# Patient Record
Sex: Female | Born: 1975 | Hispanic: Yes | Marital: Married | Smoking: Never smoker
Health system: Southern US, Community
[De-identification: ages and names within clinical notes are randomized; demographics above are authoritative.]

## PROBLEM LIST (undated history)

## (undated) ENCOUNTER — Inpatient Hospital Stay (HOSPITAL_COMMUNITY): Payer: Self-pay

## (undated) DIAGNOSIS — O139 Gestational [pregnancy-induced] hypertension without significant proteinuria, unspecified trimester: Secondary | ICD-10-CM

## (undated) DIAGNOSIS — K8020 Calculus of gallbladder without cholecystitis without obstruction: Secondary | ICD-10-CM

## (undated) DIAGNOSIS — Z973 Presence of spectacles and contact lenses: Secondary | ICD-10-CM

## (undated) DIAGNOSIS — H11153 Pinguecula, bilateral: Secondary | ICD-10-CM

## (undated) HISTORY — DX: Gestational (pregnancy-induced) hypertension without significant proteinuria, unspecified trimester: O13.9

## (undated) HISTORY — DX: Pinguecula, bilateral: H11.153

## (undated) HISTORY — DX: Calculus of gallbladder without cholecystitis without obstruction: K80.20

---

## 2003-01-31 ENCOUNTER — Ambulatory Visit (HOSPITAL_COMMUNITY): Admission: RE | Admit: 2003-01-31 | Discharge: 2003-01-31 | Payer: Self-pay | Admitting: Gynecology

## 2003-01-31 ENCOUNTER — Encounter: Payer: Self-pay | Admitting: Gynecology

## 2003-12-22 ENCOUNTER — Inpatient Hospital Stay (HOSPITAL_COMMUNITY): Admission: AD | Admit: 2003-12-22 | Discharge: 2003-12-24 | Payer: Self-pay | Admitting: Obstetrics

## 2005-01-01 ENCOUNTER — Emergency Department (HOSPITAL_COMMUNITY): Admission: EM | Admit: 2005-01-01 | Discharge: 2005-01-01 | Payer: Self-pay | Admitting: Emergency Medicine

## 2005-01-14 ENCOUNTER — Emergency Department (HOSPITAL_COMMUNITY): Admission: EM | Admit: 2005-01-14 | Discharge: 2005-01-14 | Payer: Self-pay | Admitting: Emergency Medicine

## 2005-02-24 ENCOUNTER — Emergency Department (HOSPITAL_COMMUNITY): Admission: EM | Admit: 2005-02-24 | Discharge: 2005-02-24 | Payer: Self-pay | Admitting: Emergency Medicine

## 2006-05-02 ENCOUNTER — Ambulatory Visit (HOSPITAL_COMMUNITY): Admission: RE | Admit: 2006-05-02 | Discharge: 2006-05-02 | Payer: Self-pay | Admitting: Obstetrics & Gynecology

## 2006-05-03 ENCOUNTER — Inpatient Hospital Stay (HOSPITAL_COMMUNITY): Admission: AD | Admit: 2006-05-03 | Discharge: 2006-05-03 | Payer: Self-pay | Admitting: Obstetrics & Gynecology

## 2006-05-05 ENCOUNTER — Encounter (INDEPENDENT_AMBULATORY_CARE_PROVIDER_SITE_OTHER): Payer: Self-pay | Admitting: Specialist

## 2006-05-05 ENCOUNTER — Ambulatory Visit (HOSPITAL_COMMUNITY): Admission: AD | Admit: 2006-05-05 | Discharge: 2006-05-05 | Payer: Self-pay | Admitting: Obstetrics

## 2006-06-06 ENCOUNTER — Emergency Department (HOSPITAL_COMMUNITY): Admission: EM | Admit: 2006-06-06 | Discharge: 2006-06-06 | Payer: Self-pay | Admitting: Emergency Medicine

## 2006-06-11 ENCOUNTER — Emergency Department (HOSPITAL_COMMUNITY): Admission: EM | Admit: 2006-06-11 | Discharge: 2006-06-11 | Payer: Self-pay | Admitting: Emergency Medicine

## 2008-03-21 ENCOUNTER — Inpatient Hospital Stay (HOSPITAL_COMMUNITY): Admission: AD | Admit: 2008-03-21 | Discharge: 2008-03-21 | Payer: Self-pay | Admitting: Obstetrics

## 2008-07-12 ENCOUNTER — Inpatient Hospital Stay (HOSPITAL_COMMUNITY): Admission: AD | Admit: 2008-07-12 | Discharge: 2008-07-12 | Payer: Self-pay | Admitting: Obstetrics

## 2008-08-04 ENCOUNTER — Inpatient Hospital Stay (HOSPITAL_COMMUNITY): Admission: AD | Admit: 2008-08-04 | Discharge: 2008-08-08 | Payer: Self-pay | Admitting: Obstetrics & Gynecology

## 2010-08-10 ENCOUNTER — Inpatient Hospital Stay (HOSPITAL_COMMUNITY)
Admission: AD | Admit: 2010-08-10 | Discharge: 2010-08-11 | Payer: Self-pay | Source: Home / Self Care | Attending: Obstetrics | Admitting: Obstetrics

## 2010-08-12 ENCOUNTER — Ambulatory Visit (HOSPITAL_COMMUNITY)
Admission: RE | Admit: 2010-08-12 | Discharge: 2010-08-12 | Payer: Self-pay | Source: Home / Self Care | Attending: Obstetrics and Gynecology | Admitting: Obstetrics and Gynecology

## 2010-08-16 LAB — URINALYSIS, ROUTINE W REFLEX MICROSCOPIC
Bilirubin Urine: NEGATIVE
Ketones, ur: NEGATIVE mg/dL
Leukocytes, UA: NEGATIVE
Nitrite: NEGATIVE
Protein, ur: NEGATIVE mg/dL
Specific Gravity, Urine: <1.005 — ABNORMAL LOW (ref 1.005–1.030)
Urine Glucose, Fasting: NEGATIVE mg/dL
Urobilinogen, UA: 0.2 mg/dL (ref 0.0–1.0)
pH: 6.5 (ref 5.0–8.0)

## 2010-08-16 LAB — URINE CULTURE
Colony Count: 70000
Culture  Setup Time: 201201110551

## 2010-08-16 LAB — CBC
HCT: 36.3 % (ref 36.0–46.0)
HCT: 37 % (ref 36.0–46.0)
Hemoglobin: 11.9 g/dL — ABNORMAL LOW (ref 12.0–15.0)
Hemoglobin: 12.5 g/dL (ref 12.0–15.0)
MCH: 25.5 pg — ABNORMAL LOW (ref 26.0–34.0)
MCH: 26.5 pg (ref 26.0–34.0)
MCHC: 32.8 g/dL (ref 30.0–36.0)
MCHC: 33.8 g/dL (ref 30.0–36.0)
MCV: 77.9 fL — ABNORMAL LOW (ref 78.0–100.0)
MCV: 78.4 fL (ref 78.0–100.0)
Platelets: 234 10*3/uL (ref 150–400)
Platelets: 253 10*3/uL (ref 150–400)
RBC: 4.66 MIL/uL (ref 3.87–5.11)
RBC: 4.72 MIL/uL (ref 3.87–5.11)
RDW: 13.3 % (ref 11.5–15.5)
RDW: 13.2 % (ref 11.5–15.5)
WBC: 10.7 10*3/uL — ABNORMAL HIGH (ref 4.0–10.5)
WBC: 15.7 10*3/uL — ABNORMAL HIGH (ref 4.0–10.5)

## 2010-08-16 LAB — URINE MICROSCOPIC-ADD ON

## 2010-08-16 LAB — WET PREP, GENITAL
Clue Cells Wet Prep HPF POC: NONE SEEN
Trich, Wet Prep: NONE SEEN
Yeast Wet Prep HPF POC: NONE SEEN

## 2010-08-16 LAB — GC/CHLAMYDIA PROBE AMP, GENITAL
Chlamydia, DNA Probe: NEGATIVE
GC Probe Amp, Genital: NEGATIVE

## 2010-08-16 LAB — ABO/RH: ABO/RH(D): O POS

## 2010-08-16 LAB — HCG, QUANTITATIVE, PREGNANCY: hCG, Beta Chain, Quant, S: 2044 m[IU]/mL — ABNORMAL HIGH (ref <5)

## 2010-08-26 ENCOUNTER — Ambulatory Visit
Admission: RE | Admit: 2010-08-26 | Discharge: 2010-08-26 | Payer: Self-pay | Source: Home / Self Care | Attending: Obstetrics & Gynecology | Admitting: Obstetrics & Gynecology

## 2010-08-27 NOTE — Progress Notes (Unsigned)
NAME:  DE JAMESINA, GAUGH NO.:  [id number]  MEDICAL RECORD NO.:  [id number]          PATIENT TYPE:  WOC  LOCATION:  [facility]                   FACILITY:  [facility]  PHYSICIAN:  Argentina Donovan, MD        DATE OF BIRTH:  28-Jul-1976  DATE OF SERVICE:  08/26/2010                                 CLINIC NOTE  The patient is a 35 year old Spanish-speaking Hispanic female gravida 8, para 3-0-2-3 who underwent D and C for missed AB on the 12th of this month.  It was uneventful.  Since then several days ago, she started having sciatica pain on the right side, but it did not occur directly after the procedure, so probably it was not related to positioning during the procedure.  I have encouraged her to try some Motrin and if it persists to see an orthopedic surgeon.  She had a Pap smear 6 months ago, [facility] and I am giving her 2 packs of Lo Loestrin in the meanwhile to start today to cover her until she can get there.  IMPRESSION:  Satisfactory recovery post dilation and curettage.          ______________________________ Argentina Donovan, MD    PR/MEDQ  D:  08/26/2010  T:  08/27/2010  Job:  [id number]

## 2010-11-15 LAB — URINALYSIS, ROUTINE W REFLEX MICROSCOPIC
Bilirubin Urine: NEGATIVE
Glucose, UA: NEGATIVE mg/dL
Hgb urine dipstick: NEGATIVE
Ketones, ur: NEGATIVE mg/dL
Nitrite: NEGATIVE
Protein, ur: NEGATIVE mg/dL
Specific Gravity, Urine: <1.005 — ABNORMAL LOW (ref 1.005–1.030)
Urobilinogen, UA: 0.2 mg/dL (ref 0.0–1.0)
pH: 6 (ref 5.0–8.0)

## 2010-11-15 LAB — CBC
HCT: 30.9 % — ABNORMAL LOW (ref 36.0–46.0)
HCT: 38 % (ref 36.0–46.0)
HCT: 40.1 % (ref 36.0–46.0)
Hemoglobin: 10.3 g/dL — ABNORMAL LOW (ref 12.0–15.0)
Hemoglobin: 12.5 g/dL (ref 12.0–15.0)
Hemoglobin: 13.1 g/dL (ref 12.0–15.0)
MCHC: 32.7 g/dL (ref 30.0–36.0)
MCHC: 32.7 g/dL (ref 30.0–36.0)
MCV: 81.8 fL (ref 78.0–100.0)
MCV: 82 fL (ref 78.0–100.0)
MCV: 82.3 fL (ref 78.0–100.0)
Platelets: 231 10*3/uL (ref 150–400)
Platelets: 269 10*3/uL (ref 150–400)
RBC: 4.65 MIL/uL (ref 3.87–5.11)
RBC: 4.87 MIL/uL (ref 3.87–5.11)
RDW: 14.9 % (ref 11.5–15.5)
RDW: 14.9 % (ref 11.5–15.5)
RDW: 14.9 % (ref 11.5–15.5)
WBC: 10.6 10*3/uL — ABNORMAL HIGH (ref 4.0–10.5)
WBC: 8.1 10*3/uL (ref 4.0–10.5)

## 2010-11-15 LAB — COMPREHENSIVE METABOLIC PANEL
ALT: 16 U/L (ref 0–35)
ALT: 18 U/L (ref 0–35)
AST: 21 U/L (ref 0–37)
AST: 28 U/L (ref 0–37)
Albumin: 2.6 g/dL — ABNORMAL LOW (ref 3.5–5.2)
Albumin: 2.9 g/dL — ABNORMAL LOW (ref 3.5–5.2)
Alkaline Phosphatase: 108 U/L (ref 39–117)
Alkaline Phosphatase: 117 U/L (ref 39–117)
Alkaline Phosphatase: 91 U/L (ref 39–117)
BUN: 3 mg/dL — ABNORMAL LOW (ref 6–23)
BUN: 5 mg/dL — ABNORMAL LOW (ref 6–23)
BUN: 6 mg/dL (ref 6–23)
CO2: 23 mEq/L (ref 19–32)
CO2: 23 mEq/L (ref 19–32)
Calcium: 8.6 mg/dL (ref 8.4–10.5)
Calcium: 8.8 mg/dL (ref 8.4–10.5)
Chloride: 100 mEq/L (ref 96–112)
Chloride: 106 mEq/L (ref 96–112)
Creatinine, Ser: 0.44 mg/dL (ref 0.4–1.2)
Creatinine, Ser: 0.47 mg/dL (ref 0.4–1.2)
Creatinine, Ser: 0.62 mg/dL (ref 0.4–1.2)
GFR calc Af Amer: >60 mL/min (ref >60)
GFR calc Af Amer: >60 mL/min (ref >60)
GFR calc non Af Amer: >60 mL/min (ref >60)
GFR calc non Af Amer: >60 mL/min (ref >60)
Glucose, Bld: 104 mg/dL — ABNORMAL HIGH (ref 70–99)
Glucose, Bld: 146 mg/dL — ABNORMAL HIGH (ref 70–99)
Glucose, Bld: 86 mg/dL (ref 70–99)
Potassium: 3.5 mEq/L (ref 3.5–5.1)
Potassium: 3.9 mEq/L (ref 3.5–5.1)
Potassium: 4 mEq/L (ref 3.5–5.1)
Sodium: 133 mEq/L — ABNORMAL LOW (ref 135–145)
Sodium: 138 mEq/L (ref 135–145)
Total Bilirubin: 0.3 mg/dL (ref 0.3–1.2)
Total Bilirubin: 0.4 mg/dL (ref 0.3–1.2)
Total Bilirubin: 0.8 mg/dL (ref 0.3–1.2)
Total Protein: 4.7 g/dL — ABNORMAL LOW (ref 6.0–8.3)
Total Protein: 6.2 g/dL (ref 6.0–8.3)
Total Protein: 6.7 g/dL (ref 6.0–8.3)

## 2010-11-15 LAB — URIC ACID
Uric Acid, Serum: 3.8 mg/dL (ref 2.4–7.0)
Uric Acid, Serum: 4.3 mg/dL (ref 2.4–7.0)
Uric Acid, Serum: 4.4 mg/dL (ref 2.4–7.0)

## 2010-11-15 LAB — PROTEIN, URINE, 24 HOUR
Collection Interval-UPROT: 24 hours
Protein, 24H Urine: 92 mg/d (ref 50–100)
Protein, Urine: 3 mg/dL
Urine Total Volume-UPROT: 3050 mL

## 2010-11-15 LAB — CREATININE CLEARANCE, URINE, 24 HOUR
Collection Interval-CRCL: 24 hours
Creatinine Clearance: 215 mL/min — ABNORMAL HIGH (ref 75–115)
Creatinine, 24H Ur: 1453 mg/d (ref 700–1800)
Creatinine, Urine: 47.6 mg/dL
Creatinine: 0.47 mg/dL (ref 0.40–1.20)
Urine Total Volume-CRCL: 3050 mL

## 2010-11-15 LAB — LACTATE DEHYDROGENASE
LDH: 121 U/L (ref 94–250)
LDH: 204 U/L (ref 94–250)
LDH: 95 U/L (ref 94–250)

## 2010-12-14 NOTE — H&P (Signed)
NAMEMAIA, HANDA NO.:  [id number]   MEDICAL RECORD NO.:  [id number]          PATIENT TYPE:  INP   LOCATION:  9156                          [facility]   PHYSICIAN:  Roseanna Rainbow, M.D.DATE OF BIRTH:  [birth year]   DATE OF ADMISSION:  08/04/2008  DATE OF DISCHARGE:                              HISTORY & PHYSICAL   CHIEF COMPLAINT:  The patient is a 35 year old para 2 with an estimated  date of confinement of August 31, 2008, with an intrauterine pregnancy  at 36 plus weeks with elevated blood pressures.   HISTORY OF PRESENT ILLNESS:  The patient had presented to the office  earlier today and was found to have elevated blood pressures.  She  denied any neurological symptoms.  For the last 2 weeks, the patient's  blood pressures at our office visits were in the 130s to 160s over 80s  to 90s.  There is no significant proteinuria.   ALLERGIES:  No known drug allergies.   PRENATAL LABORATORIES:  Blood type O+.  Antibody screen negative.  Urine  culture and sensitivity, no growth.  Chlamydia probe negative.  GC probe  negative.  One-hour GTT 177.  Three-hour GTT fasting 94, otherwise  normal 3-hour GTT.  Hepatitis B surface antigen negative.  Hematocrit  38.5, hemoglobin 12.1.  HIV nonreactive.  Platelet count 295,000.  RPR  nonreactive.  Rubella immune.  Sickle cell negative.  Varicella immune.   PAST OBSTETRICAL HISTORY:  There is a history of a spontaneous abortion  in September 1999.  She was delivered of a live born female 7 pounds 5  ounces, vaginal delivery, no complications.  In May 2005, she was  delivered of a live born female 7 pounds 3 ounces.   PAST GYN HISTORY:  Noncontributory.   PAST MEDICAL HISTORY:  No significant history of medical diseases.   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  Does not give any significant history of alcohol usage, has no  significant smoking history.  Denies illicit drug  use.   FAMILY HISTORY:  Remarkable for chronic hypertension.   REVIEW OF SYSTEMS:  NEUROLOGIC:  She denies any headache, visual  disturbances.  GASTROINTESTINAL:  She denies any epigastric pain,  nausea, or vomiting.  PULMONARY:  She denies any shortness of breath.   PHYSICAL EXAMINATION:  VITAL SIGNS:  Blood pressures 130s to 160s over  80s to 100.  Fetal heart tracing reassuring.  GENERAL:  No apparent distress.  ABDOMEN:  Gravid, nontender.  GU:  Sterile vaginal exam, per the RN the cervix is 3 cm dilated, 50%  effaced.   LABORATORY DATA:  Hemoglobin 13.1, hematocrit 40.1, platelets 269,000.  Urinalysis, negative for protein, glucose 86, creatinine 0.47.  LFTs  normal.  LDH and uric acid all normal.  An ultrasound, normal amniotic  fluid index.  Anterior placenta, no previa.  Cephalic presentation.  Estimated fetal weight percentile at the 60th percentile.   ASSESSMENT:  Multipara at 36 weeks with likely gestational hypertension.  Labs remarkable for hemoconcentration. Appropriately grown fetus.   PLAN:  Admission.  We will check a 24-hour urine for protein and  creatinine, daily NSTs, bedrest, serial blood pressure checks.      Roseanna Rainbow, M.D.  Electronically Signed     LAJ/MEDQ  D:  08/04/2008  T:  08/05/2008  Job:  [id number]

## 2010-12-14 NOTE — Discharge Summary (Signed)
NAMECATHRYN, [name]             ACCOUNT NO.:  [id number]   MEDICAL RECORD NO.:  [id number]          PATIENT TYPE:  INP   LOCATION:  9128                          [facility]   PHYSICIAN:  Charles A. Clearance Coots, M.D.DATE OF BIRTH:  Dec 09, 1975   DATE OF ADMISSION:  08/04/2008  DATE OF DISCHARGE:  08/08/2008                               DISCHARGE SUMMARY   ADMITTING DIAGNOSES:  1. 36 weeks' gestation.  2. Pregnancy-induced hypertension.   DISCHARGE DIAGNOSES:  1. 36 weeks' gestation.  2. Pregnancy-induced hypertension.  3. Superimposed preeclampsia status post normal spontaneous vaginal      delivery viable female infant by induction of labor on August 06, 2008, at 36-4/7th weeks' gestation.  4. Severe preeclampsia.   Delivery time was 15:36 on August 06, 2008.  Apgars of 9 at 1 minute and  9 at 5 minutes, weight of 2465 g, length of 46.99 cm.  Mother and infant  discharged home in good condition.   REASON FOR ADMISSION:  A 35 year old, para 2, with estimated date of  confinement of August 31, 2008, with intrauterine pregnancy at 71  weeks' gestation that presented to the office with elevated blood  pressures.  She denied any neurological symptoms.  The patient's blood  pressure during her prenatal visits were 130-160 over 80-90 over the  past 2 weeks.  There was no significant proteinuria.   SURGERY:  None.   ILLNESSES:  None.   MEDICATIONS:  Prenatal vitamins.   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  Negative tobacco, alcohol, or recreational drug use.   FAMILY HISTORY:  Chronic hypertension.   REVIEW OF SYSTEMS:  Negative for headache or visual disturbances.  Negative for epigastric pain, nausea or vomiting.  Negative for  shortness of breath.   PHYSICAL EXAMINATION:  GENERAL:  Well-nourished, well-developed female  in no acute distress.  VITAL SIGNS:  Afebrile.  Blood pressures 130-160 over  80-100.  LUNGS:  Clear to auscultation bilaterally.  HEART:  Regular rate and rhythm.  ABDOMEN:  Gravid, nontender.  Cervix 3 cm dilated, 50% effaced, vertex  at a -3 station.   IMPRESSION:  A 36 weeks' gestation, pregnancy-induced hypertension.   PLAN:  Admit, bedrest, blood pressure management, will start 24-hour  urine for total protein and creatinine clearance.   ADMITTING LABORATORY DATA:  Hemoglobin 13, hematocrit 40, white blood  cell count 10,600, platelets 269,000.  Sodium 133, potassium 3.9, BUN 5,  creatinine 0.47.  Uric acid 4.3.  RPR nonreactive.   HOSPITAL COURSE:  The patient was admitted and placed on bedrest, 24-  hour urine was started.  Blood pressures remained stable.  A 24-hour  urine was completed and revealed a total protein revealed 92 mg per 24  hours, creatinine clearance of 215 mL per minute which were both within  normal limits.  The patient continued to have stable blood pressures  until hospital day 3 when blood pressures were elevated to systolic  blood pressures 160-170 over diastolic pressures of 90-100 plus.  A  decision was made to proceed with induction of labor for severe  pregnancy-induced hypertension.  The patient had already been started on  magnesium sulfate and p.o. labetalol for blood pressure control and this  was continued.  She was transferred to Labor and Delivery, and Pitocin  induction of labor was started.  She progressed rapidly to precipitous  normal spontaneous vaginal delivery on August 06, 2008.  There were no  intrapartum complications.  Postpartum course was uncomplicated.  The  patient was discharged home with stable blood pressures on p.o.  labetalol for blood pressure control on postpartum day #2.   DISCHARGE LABORATORY DATA:  Hemoglobin of 10, hematocrit of 30, white  blood cell count 10,900, platelets 200,000.   DISCHARGE DISPOSITION:  Medications continue labetalol for blood  pressure control.  Continue prenatal  vitamins.  Ibuprofen was prescribed  for pain.  Routine written instructions were given for discharge after  vaginal delivery.  The patient was given instructions for symptoms of  preeclampsia such as headache and visual disturbances.  She is to call  or return to the hospital if those occur.  Otherwise, she was instructed  to return to the office in 2 weeks for checkup.      Charles A. Clearance Coots, M.D.  Electronically Signed     CAH/MEDQ  D:  08/08/2008  T:  08/09/2008  Job:  [id number]

## 2010-12-17 NOTE — Op Note (Signed)
[name], KOZINSKI                ACCOUNT NO.:  [id number]   MEDICAL RECORD NO.:  [id number]          PATIENT TYPE:  AMB   LOCATION:  [facility]                           [facility]   PHYSICIAN:  Charles A. Clearance Coots, M.D.DATE OF BIRTH:  10-21-1975   DATE OF PROCEDURE:  05/05/2006  DATE OF DISCHARGE:                                 OPERATIVE REPORT   PREOP DIAGNOSIS:  1. A 14-week intrauterine fetal demise.  2. Incomplete spontaneous abortion.   POSTOP DIAGNOSIS:  1. A 14-week intrauterine fetal demise.  2. Incomplete spontaneous abortion.   PROCEDURE:  Suction, evacuation, and curettage.   SURGEON:  Charles A. Clearance Coots, M.D.   ANESTHESIA:  General and local paracervical block.   ESTIMATED BLOOD LOSS:  400 mL   COMPLICATIONS:  None   SPECIMEN:  Products of conception.   DISPOSITION OF SPECIMEN:  To pathology   FINDINGS:  Spontaneous delivery of fetus in triage, retained placenta.  Moderate amount of hemorrhage   OPERATION:  The patient was brought to the operating room; and after  satisfactory general endotracheal anesthesia the legs were brought up in  stirrups and the urinary bladder was emptied of approximately 20 mL of clear  urine.  The vagina was prepped and draped in the usual sterile fashion.  Sterile speculum was inserted in the vaginal vault and the cervix was noted  to be open; and the cord and placenta was in the cervical os.  The anterior  lip of cervix was grasped with a small ring forceps; and the body of the  placenta was grasped with ring forceps and removed.  The specimen was  submitted to pathology for evaluation.  A #12 suction catheter was then  easily introduced into the uterine cavity and the remainder of the products  of conception were evacuated.  The endometrial surface was curetted with a  medium sharp curette; and no further products of conception were obtained.  The  endometrial surface was felt to be gritty and the uterus contracted down  quite  well and there was no active bleeding at the conclusion of procedure,  all instruments were then retired.  All specimens were submitted to  pathology for evaluation.  The patient tolerated the procedure well,  transported to recovery room in satisfactory condition.      Charles A. Clearance Coots, M.D.  Electronically Signed     CAH/MEDQ  D:  05/05/2006  T:  05/05/2006  Job:  [id number]

## 2010-12-17 NOTE — H&P (Signed)
[name], [name]                ACCOUNT NO.:  [id number]   MEDICAL RECORD NO.:  [id number]          PATIENT TYPE:  AMB   LOCATION:  [facility]C                           [facility]   PHYSICIAN:  Roseanna Rainbow, M.D.DATE OF BIRTH:  1976/02/18   DATE OF ADMISSION:  DATE OF DISCHARGE:                                HISTORY & PHYSICAL   CHIEF COMPLAINT:  The patient is a 35 year old with a 14-week intrauterine  fetal demise, now for a suction dilatation and evacuation.   HISTORY OF PRESENT ILLNESS:  Please see the above.  The patient had  presented to the office several days ago with vaginal bleeding.  An informal  ultrasound in the office failed to demonstrate cardiac activity.  Demise was  confirmed in the radiology department at [facility].   PAST GYN HISTORY:  Noncontributory.   PAST MEDICAL HISTORY:  No significant history of medical diseases.   PAST SURGICAL HISTORY:  No previous surgeries.   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  She  does not give any significant history of alcohol use, and she has no  significant smoking history.  She denies illicit drug use.   FAMILY HISTORY:  No major illnesses known.   ALLERGIES:  NO KNOWN DRUG ALLERGIES.   PAST OB HISTORY:  She has had two normal spontaneous vaginal deliveries at  term, uncomplicated.   PHYSICAL EXAMINATION:  VITAL SIGNS:  Weight 144 pounds, blood pressure  124/81, temperature 98.1, pulse 85.  GENERAL:  Well-developed, well-nourished individual in no acute distress.  ABDOMEN:  Soft, nontender.  LUNGS:  Clear to auscultation bilaterally.  HEART:  Regular rate and rhythm.  PELVIC:  Normal EGBUS.  Vaginal exam, there is small heme-brown noted.  The  cervix was closed.   ASSESSMENT:  Fetal demise at 14 weeks.  The patient's blood type is O  positive.   PLAN:  The planned procedure is a dilatation and evacuation.  Will use  Cytotec preoperatively for cervical dilatation.   The risks, benefits, and  alternatives forms of management were reviewed with the patient, and  informed consent had been obtained.      Roseanna Rainbow, M.D.  Electronically Signed     LAJ/MEDQ  D:  05/04/2006  T:  05/04/2006  Job:  [id number]

## 2011-05-06 LAB — URINALYSIS, ROUTINE W REFLEX MICROSCOPIC
Bilirubin Urine: NEGATIVE
Ketones, ur: 15 mg/dL — AB
Nitrite: NEGATIVE
Specific Gravity, Urine: <1.005 — ABNORMAL LOW (ref 1.005–1.030)
Urobilinogen, UA: 0.2 mg/dL (ref 0.0–1.0)

## 2012-08-01 NOTE — L&D Delivery Note (Signed)
Delivery Note At 7:18 PM a viable female was delivered via  (Presentation: ;  ).  APGAR: , ; weight .   Placenta status: , .  Cord:  with the following complications: .  Cord pH: not done  Anesthesia:   Episiotomy:  Lacerations:  Suture Repair: 2.0 Est. Blood Loss (mL):   Mom to postpartum.  Baby to Couplet care / Skin to Skin.  MARSHALL,BERNARD A 06/10/2013, 7:26 PM

## 2012-11-28 ENCOUNTER — Ambulatory Visit (INDEPENDENT_AMBULATORY_CARE_PROVIDER_SITE_OTHER): Payer: BC Managed Care – PPO

## 2012-11-28 ENCOUNTER — Ambulatory Visit (INDEPENDENT_AMBULATORY_CARE_PROVIDER_SITE_OTHER): Payer: Self-pay | Admitting: Obstetrics

## 2012-11-28 ENCOUNTER — Encounter: Payer: Self-pay | Admitting: Obstetrics

## 2012-11-28 ENCOUNTER — Other Ambulatory Visit: Payer: Self-pay | Admitting: Obstetrics

## 2012-11-28 VITALS — BP 135/86 | Temp 98.0°F | Ht 62.0 in | Wt 154.4 lb

## 2012-11-28 DIAGNOSIS — O26849 Uterine size-date discrepancy, unspecified trimester: Secondary | ICD-10-CM

## 2012-11-28 DIAGNOSIS — O365990 Maternal care for other known or suspected poor fetal growth, unspecified trimester, not applicable or unspecified: Secondary | ICD-10-CM

## 2012-11-28 DIAGNOSIS — Z3482 Encounter for supervision of other normal pregnancy, second trimester: Secondary | ICD-10-CM

## 2012-11-28 DIAGNOSIS — Z113 Encounter for screening for infections with a predominantly sexual mode of transmission: Secondary | ICD-10-CM

## 2012-11-28 DIAGNOSIS — O09529 Supervision of elderly multigravida, unspecified trimester: Secondary | ICD-10-CM

## 2012-11-28 DIAGNOSIS — O368390 Maternal care for abnormalities of the fetal heart rate or rhythm, unspecified trimester, not applicable or unspecified: Secondary | ICD-10-CM

## 2012-11-28 DIAGNOSIS — Z124 Encounter for screening for malignant neoplasm of cervix: Secondary | ICD-10-CM

## 2012-11-28 DIAGNOSIS — Z3201 Encounter for pregnancy test, result positive: Secondary | ICD-10-CM

## 2012-11-28 LAB — POCT URINALYSIS DIPSTICK
Bilirubin, UA: NEGATIVE
Blood, UA: NEGATIVE
Glucose, UA: NEGATIVE
Nitrite, UA: NEGATIVE

## 2012-11-28 LAB — US OB COMP LESS 14 WKS

## 2012-11-28 NOTE — Progress Notes (Signed)
Size<Dates U/S reveals 11.5 week viable gestation.

## 2012-11-28 NOTE — Progress Notes (Signed)
.   Subjective:    [name] is being seen today for her first obstetrical visit.  This is a planned pregnancy. She is at [redacted]w[redacted]d gestation. Her obstetrical history is significant for advanced maternal age. Relationship with FOB: spouse, living together. Patient does intend to breast feed. Pregnancy history fully reviewed.  Menstrual History: OB History   Grav Para Term Preterm Abortions TAB SAB Ect Mult Living   6 3 3  2  2   3       Menarche age: Patient's last menstrual period was 08/22/2012.    The following portions of the patient's history were reviewed and updated as appropriate: allergies, current medications, past family history, past medical history, past social history, past surgical history and problem list.  Review of Systems Pertinent items are noted in HPI.    Objective:    General appearance: alert and no distress Abdomen: normal findings: soft, non-tender Pelvic: Uterus 11 weeks size.  Soft, nontender.   Assessment:    Pregnancy at 11 weeks.  Size<Dates U/S done and confirms 11.5 week viable IUP.   Plan:    Initial labs drawn. Prenatal vitamins. Problem list reviewed and updated. AFP3 discussed: undecided. Role of ultrasound in pregnancy discussed; fetal survey: undecided. Amniocentesis discussed: undecided. Follow up in 4 weeks. 50% of 20 min visit spent on counseling and coordination of care.

## 2012-11-29 ENCOUNTER — Encounter: Payer: Self-pay | Admitting: Obstetrics

## 2012-11-29 LAB — OBSTETRIC PANEL
Basophils Relative: 0 % (ref 0–1)
Eosinophils Absolute: 0.1 10*3/uL (ref 0.0–0.7)
Hepatitis B Surface Ag: NEGATIVE
MCH: 25.9 pg — ABNORMAL LOW (ref 26.0–34.0)
MCHC: 34 g/dL (ref 30.0–36.0)
Neutrophils Relative %: 63 % (ref 43–77)
Platelets: 288 10*3/uL (ref 150–400)
RDW: 15.2 % (ref 11.5–15.5)
Rh Type: POSITIVE

## 2012-11-29 LAB — VITAMIN D 25 HYDROXY (VIT D DEFICIENCY, FRACTURES): Vit D, 25-Hydroxy: 23 ng/mL — ABNORMAL LOW (ref 30–89)

## 2012-11-29 LAB — WET PREP BY MOLECULAR PROBE
Candida species: NEGATIVE
Gardnerella vaginalis: POSITIVE — AB
Trichomonas vaginosis: NEGATIVE

## 2012-11-29 LAB — PAP IG W/ RFLX HPV ASCU

## 2012-11-29 LAB — OB RESULTS CONSOLE GC/CHLAMYDIA: Chlamydia: NEGATIVE

## 2012-11-29 LAB — VARICELLA ZOSTER ANTIBODY, IGG: Varicella IgG: 760.8 Index — ABNORMAL HIGH (ref <135.00)

## 2012-11-30 LAB — CULTURE, OB URINE: Organism ID, Bacteria: NO GROWTH

## 2012-11-30 LAB — HEMOGLOBINOPATHY EVALUATION
Hemoglobin Other: 0 %
Hgb A2 Quant: 2.3 % (ref 2.2–3.2)
Hgb A: 97.7 % (ref 96.8–97.8)
Hgb F Quant: 0 % (ref 0.0–2.0)
Hgb S Quant: 0 %

## 2012-12-04 ENCOUNTER — Other Ambulatory Visit: Payer: Self-pay | Admitting: *Deleted

## 2012-12-04 DIAGNOSIS — B9689 Other specified bacterial agents as the cause of diseases classified elsewhere: Secondary | ICD-10-CM

## 2012-12-04 MED ORDER — METRONIDAZOLE 500 MG PO TABS: 500.0000 mg | ORAL_TABLET | Freq: Two times a day (BID) | ORAL | Status: DC

## 2012-12-06 ENCOUNTER — Other Ambulatory Visit: Payer: Self-pay | Admitting: *Deleted

## 2012-12-06 DIAGNOSIS — Z3482 Encounter for supervision of other normal pregnancy, second trimester: Secondary | ICD-10-CM

## 2012-12-06 MED ORDER — OB COMPLETE PETITE 35-5-1-200 MG PO CAPS: 1.0000 | ORAL_CAPSULE | Freq: Every day | ORAL | Status: DC

## 2012-12-06 NOTE — Progress Notes (Signed)
Pt has been advised of her test results regarding her low Vitamin D level. Rx for OB Complete Petite called to the pharamcy.

## 2012-12-27 ENCOUNTER — Ambulatory Visit (INDEPENDENT_AMBULATORY_CARE_PROVIDER_SITE_OTHER): Payer: BC Managed Care – PPO | Admitting: Obstetrics

## 2012-12-27 VITALS — BP 116/77 | Temp 98.2°F | Wt 155.0 lb

## 2012-12-27 DIAGNOSIS — O09522 Supervision of elderly multigravida, second trimester: Secondary | ICD-10-CM

## 2012-12-27 DIAGNOSIS — O09529 Supervision of elderly multigravida, unspecified trimester: Secondary | ICD-10-CM

## 2012-12-27 DIAGNOSIS — Z3482 Encounter for supervision of other normal pregnancy, second trimester: Secondary | ICD-10-CM

## 2012-12-27 DIAGNOSIS — Z369 Encounter for antenatal screening, unspecified: Secondary | ICD-10-CM

## 2012-12-27 DIAGNOSIS — Z3480 Encounter for supervision of other normal pregnancy, unspecified trimester: Secondary | ICD-10-CM

## 2012-12-27 LAB — POCT URINALYSIS DIPSTICK
Glucose, UA: NEGATIVE
Nitrite, UA: NEGATIVE
Spec Grav, UA: 1.015
Urobilinogen, UA: NEGATIVE

## 2012-12-27 NOTE — Progress Notes (Signed)
Pulse: 89

## 2012-12-28 ENCOUNTER — Encounter: Payer: Self-pay | Admitting: Obstetrics

## 2012-12-28 ENCOUNTER — Encounter (HOSPITAL_COMMUNITY): Payer: Self-pay | Admitting: Obstetrics

## 2012-12-28 LAB — AFP, QUAD SCREEN
AFP: 28 IU/mL
HCG, Total: 12020 m[IU]/mL
Interpretation-AFP: NEGATIVE
MoM for AFP: 1.02
Open Spina bifida: NEGATIVE
uE3 Mom: 0.81

## 2013-01-08 ENCOUNTER — Encounter: Payer: Self-pay | Admitting: Obstetrics

## 2013-01-10 ENCOUNTER — Other Ambulatory Visit: Payer: Self-pay | Admitting: Obstetrics

## 2013-01-10 DIAGNOSIS — O09529 Supervision of elderly multigravida, unspecified trimester: Secondary | ICD-10-CM

## 2013-01-11 ENCOUNTER — Ambulatory Visit (HOSPITAL_COMMUNITY): Payer: Self-pay | Attending: Obstetrics

## 2013-01-11 ENCOUNTER — Ambulatory Visit (HOSPITAL_COMMUNITY): Payer: Self-pay

## 2013-01-24 ENCOUNTER — Ambulatory Visit (INDEPENDENT_AMBULATORY_CARE_PROVIDER_SITE_OTHER): Payer: BC Managed Care – PPO | Admitting: Obstetrics

## 2013-01-24 ENCOUNTER — Encounter: Payer: Self-pay | Admitting: Obstetrics

## 2013-01-24 VITALS — BP 126/79 | Temp 98.0°F | Wt 156.8 lb

## 2013-01-24 DIAGNOSIS — Z369 Encounter for antenatal screening, unspecified: Secondary | ICD-10-CM

## 2013-01-24 DIAGNOSIS — Z3482 Encounter for supervision of other normal pregnancy, second trimester: Secondary | ICD-10-CM

## 2013-01-24 DIAGNOSIS — Z3480 Encounter for supervision of other normal pregnancy, unspecified trimester: Secondary | ICD-10-CM

## 2013-01-24 LAB — POCT URINALYSIS DIPSTICK
Bilirubin, UA: NEGATIVE
Blood, UA: NEGATIVE
Glucose, UA: NEGATIVE
Ketones, UA: NEGATIVE
Leukocytes, UA: NEGATIVE
pH, UA: 5

## 2013-01-24 NOTE — Progress Notes (Signed)
Pulse- 98.  Leg cramps

## 2013-01-29 ENCOUNTER — Other Ambulatory Visit: Payer: BC Managed Care – PPO

## 2013-02-21 ENCOUNTER — Ambulatory Visit (INDEPENDENT_AMBULATORY_CARE_PROVIDER_SITE_OTHER): Payer: BC Managed Care – PPO | Admitting: Obstetrics

## 2013-02-21 VITALS — BP 115/81 | Temp 98.2°F | Wt 160.2 lb

## 2013-02-21 DIAGNOSIS — Z3482 Encounter for supervision of other normal pregnancy, second trimester: Secondary | ICD-10-CM

## 2013-02-21 DIAGNOSIS — Z3480 Encounter for supervision of other normal pregnancy, unspecified trimester: Secondary | ICD-10-CM

## 2013-02-21 LAB — POCT URINALYSIS DIPSTICK
Bilirubin, UA: NEGATIVE
Blood, UA: NEGATIVE
Glucose, UA: NEGATIVE
Nitrite, UA: NEGATIVE
Spec Grav, UA: <=1.005

## 2013-02-21 NOTE — Progress Notes (Signed)
Pulse: 85

## 2013-03-07 ENCOUNTER — Other Ambulatory Visit: Payer: Medicaid Other

## 2013-03-07 ENCOUNTER — Ambulatory Visit (INDEPENDENT_AMBULATORY_CARE_PROVIDER_SITE_OTHER): Payer: Medicaid Other | Admitting: Obstetrics

## 2013-03-07 VITALS — BP 125/79 | Temp 97.9°F | Wt 163.0 lb

## 2013-03-07 DIAGNOSIS — Z3480 Encounter for supervision of other normal pregnancy, unspecified trimester: Secondary | ICD-10-CM

## 2013-03-07 DIAGNOSIS — Z3483 Encounter for supervision of other normal pregnancy, third trimester: Secondary | ICD-10-CM

## 2013-03-07 DIAGNOSIS — O09523 Supervision of elderly multigravida, third trimester: Secondary | ICD-10-CM

## 2013-03-07 DIAGNOSIS — Z3482 Encounter for supervision of other normal pregnancy, second trimester: Secondary | ICD-10-CM

## 2013-03-07 DIAGNOSIS — O09529 Supervision of elderly multigravida, unspecified trimester: Secondary | ICD-10-CM

## 2013-03-07 LAB — POCT URINALYSIS DIPSTICK
Blood, UA: NEGATIVE
Glucose, UA: NEGATIVE
Nitrite, UA: NEGATIVE
Protein, UA: NEGATIVE
Spec Grav, UA: 1.02
Urobilinogen, UA: NEGATIVE
pH, UA: 5

## 2013-03-07 LAB — CBC
Hemoglobin: 11.6 g/dL — ABNORMAL LOW (ref 12.0–15.0)
MCH: 26.5 pg (ref 26.0–34.0)
MCHC: 33.5 g/dL (ref 30.0–36.0)
Platelets: 268 10*3/uL (ref 150–400)

## 2013-03-07 NOTE — Progress Notes (Signed)
Pulse: 88

## 2013-03-08 ENCOUNTER — Encounter: Payer: Self-pay | Admitting: Obstetrics

## 2013-03-08 LAB — GLUCOSE TOLERANCE, 2 HOURS W/ 1HR
Glucose, 1 hour: 132 mg/dL (ref 70–170)
Glucose, 2 hour: 101 mg/dL (ref 70–139)
Glucose, Fasting: 69 mg/dL — ABNORMAL LOW (ref 70–99)

## 2013-03-08 LAB — RPR

## 2013-03-08 LAB — HIV ANTIBODY (ROUTINE TESTING W REFLEX): HIV: NONREACTIVE

## 2013-03-14 ENCOUNTER — Inpatient Hospital Stay (HOSPITAL_COMMUNITY): Admission: AD | Admit: 2013-03-14 | Payer: Self-pay | Source: Ambulatory Visit | Admitting: Obstetrics

## 2013-03-14 ENCOUNTER — Other Ambulatory Visit: Payer: Self-pay | Admitting: Obstetrics

## 2013-03-14 DIAGNOSIS — O09529 Supervision of elderly multigravida, unspecified trimester: Secondary | ICD-10-CM

## 2013-03-15 ENCOUNTER — Encounter: Payer: Self-pay | Admitting: Obstetrics

## 2013-03-15 ENCOUNTER — Ambulatory Visit (HOSPITAL_COMMUNITY)
Admission: RE | Admit: 2013-03-15 | Discharge: 2013-03-15 | Disposition: A | Discharge status: Home / Self Care | Payer: Medicaid Other | Source: Ambulatory Visit | Attending: Obstetrics | Admitting: Obstetrics

## 2013-03-15 DIAGNOSIS — O09529 Supervision of elderly multigravida, unspecified trimester: Secondary | ICD-10-CM | POA: Insufficient documentation

## 2013-03-15 DIAGNOSIS — Z363 Encounter for antenatal screening for malformations: Secondary | ICD-10-CM | POA: Insufficient documentation

## 2013-03-15 DIAGNOSIS — O358XX0 Maternal care for other (suspected) fetal abnormality and damage, not applicable or unspecified: Secondary | ICD-10-CM | POA: Insufficient documentation

## 2013-03-15 DIAGNOSIS — Z1389 Encounter for screening for other disorder: Secondary | ICD-10-CM | POA: Insufficient documentation

## 2013-03-15 LAB — US OB DETAIL + 14 WK

## 2013-03-15 NOTE — Progress Notes (Signed)
Genetic Counseling  High-Risk Gestation Note  Appointment Date:  03/15/2013 Referred By: Brock Bad, MD Date of Birth:  [birth year] Partner:  [name]    Pregnancy History: [id number] Estimated Date of Delivery: 06/14/13 Estimated Gestational Age: [redacted]w[redacted]d Attending: Particia Nearing, MD   [name], and her husband, [name], were seen for genetic counseling regarding a maternal age of 37 y.o.Marland Kitchen They were accompanied by their three children.   They were counseled regarding maternal age and the association with risk for chromosome conditions due to nondisjunction with aging of the ova.   We reviewed chromosomes, nondisjunction, and the associated 1 in 21 risk for fetal aneuploidy related to a maternal age of 37 y.o. at [redacted]w[redacted]d weeks gestation.  They were counseled that the risk for aneuploidy decreases as gestational age increases, accounting for those pregnancies which spontaneously abort.  We specifically discussed Down syndrome (trisomy 60), trisomies 23 and 52, and sex chromosome aneuploidies (47,XXX and 47,XXY) including the common features and prognoses of each.   We also reviewed [name]'s maternal serum Quad screen result and the associated reduction in risks for fetal Down syndrome (1 in 129 to 1 in 4800), trisomy 18 (1 in 2030), and ONTDs (1 in 12,400).  They understand that Quad screening provides a pregnancy specific risk for Down syndrome, but is not considered to be diagnostic.  They were counseled regarding other available screening options including noninvasive prenatal screening (NIPS) and detailed ultrasound. They were also counseled regarding the diagnostic testing option of amniocentesis.  The risks, benefits, and limitations of each of these options were reviewed in detail including the conditions for which each screens, the detection rates and false positive rates of each. We reviewed the associated 1 in 300-500 risk for complications, including  spontaneous preterm labor and delivery. After thoughtful consideration of these options, she elected to proceed with detailed ultrasound today, but declined NIPS and amniocentesis.  They understand that screening tests cannot rule out all birth defects or genetic syndromes. A complete detailed ultrasound was performed today.  The ultrasound report will be sent under separate cover.    [name] was provided with written information regarding cystic fibrosis (CF) including the carrier frequency and incidence in the Hispanic population, the availability of carrier testing and prenatal diagnosis if indicated.  In addition, we discussed that CF is routinely screened for as part of the [place] newborn screening panel.  She declined testing today.   Both family histories were reviewed and found to be noncontributory for birth defects, mental retardation, and known genetic conditions. Without further information regarding the provided family history, an accurate genetic risk cannot be calculated. Further genetic counseling is warranted if more information is obtained.  [name] denied exposure to environmental toxins or chemical agents. She denied the use of alcohol, tobacco or street drugs. She denied significant viral illnesses during the course of her pregnancy. Her medical and surgical histories were noncontributory.    I counseled this couple regarding the above risks and available options.  The approximate face-to-face time with the genetic counselor was 16 minutes.  Quinn Plowman, MS Certified Genetic Counselor 03/15/2013

## 2013-03-21 ENCOUNTER — Encounter: Payer: Self-pay | Admitting: Obstetrics

## 2013-03-21 ENCOUNTER — Ambulatory Visit (INDEPENDENT_AMBULATORY_CARE_PROVIDER_SITE_OTHER): Payer: Medicaid Other | Admitting: Obstetrics

## 2013-03-21 VITALS — BP 124/81 | Temp 96.9°F | Wt 167.0 lb

## 2013-03-21 DIAGNOSIS — Z3482 Encounter for supervision of other normal pregnancy, second trimester: Secondary | ICD-10-CM

## 2013-03-21 DIAGNOSIS — Z3483 Encounter for supervision of other normal pregnancy, third trimester: Secondary | ICD-10-CM

## 2013-03-21 DIAGNOSIS — Z3480 Encounter for supervision of other normal pregnancy, unspecified trimester: Secondary | ICD-10-CM

## 2013-03-21 LAB — POCT URINALYSIS DIPSTICK
Glucose, UA: NEGATIVE
Ketones, UA: NEGATIVE
Spec Grav, UA: >=1.03

## 2013-03-21 NOTE — Progress Notes (Signed)
Pulse 90, patient states she has no concerns

## 2013-03-25 ENCOUNTER — Encounter: Payer: Self-pay | Admitting: Obstetrics

## 2013-03-25 ENCOUNTER — Ambulatory Visit (INDEPENDENT_AMBULATORY_CARE_PROVIDER_SITE_OTHER): Payer: Medicaid Other | Admitting: Obstetrics

## 2013-03-25 VITALS — BP 115/78 | Temp 98.0°F | Wt 166.0 lb

## 2013-03-25 DIAGNOSIS — Z3480 Encounter for supervision of other normal pregnancy, unspecified trimester: Secondary | ICD-10-CM

## 2013-03-25 DIAGNOSIS — Z3483 Encounter for supervision of other normal pregnancy, third trimester: Secondary | ICD-10-CM

## 2013-03-25 LAB — POCT URINALYSIS DIPSTICK
Bilirubin, UA: NEGATIVE
Ketones, UA: NEGATIVE

## 2013-03-25 NOTE — Progress Notes (Signed)
P-85 

## 2013-04-08 ENCOUNTER — Encounter: Payer: Self-pay | Admitting: Obstetrics

## 2013-04-08 ENCOUNTER — Ambulatory Visit (INDEPENDENT_AMBULATORY_CARE_PROVIDER_SITE_OTHER): Payer: Medicaid Other | Admitting: Obstetrics

## 2013-04-08 VITALS — BP 127/85 | Temp 98.4°F | Wt 168.4 lb

## 2013-04-08 DIAGNOSIS — K219 Gastro-esophageal reflux disease without esophagitis: Secondary | ICD-10-CM

## 2013-04-08 DIAGNOSIS — Z3483 Encounter for supervision of other normal pregnancy, third trimester: Secondary | ICD-10-CM

## 2013-04-08 DIAGNOSIS — Z3480 Encounter for supervision of other normal pregnancy, unspecified trimester: Secondary | ICD-10-CM

## 2013-04-08 LAB — POCT URINALYSIS DIPSTICK
Bilirubin, UA: NEGATIVE
Leukocytes, UA: NEGATIVE
Nitrite, UA: NEGATIVE

## 2013-04-08 MED ORDER — OMEPRAZOLE 20 MG PO CPDR: 20.0000 mg | DELAYED_RELEASE_CAPSULE | Freq: Every day | ORAL | Status: DC

## 2013-04-08 NOTE — Progress Notes (Signed)
Pulse- 101 

## 2013-04-18 ENCOUNTER — Other Ambulatory Visit: Payer: Self-pay | Admitting: Obstetrics

## 2013-04-18 DIAGNOSIS — O09523 Supervision of elderly multigravida, third trimester: Secondary | ICD-10-CM

## 2013-04-22 ENCOUNTER — Ambulatory Visit (INDEPENDENT_AMBULATORY_CARE_PROVIDER_SITE_OTHER): Payer: Medicaid Other | Admitting: Obstetrics

## 2013-04-22 ENCOUNTER — Encounter: Payer: Self-pay | Admitting: Obstetrics

## 2013-04-22 VITALS — BP 119/79 | Temp 98.3°F | Wt 170.8 lb

## 2013-04-22 DIAGNOSIS — O09529 Supervision of elderly multigravida, unspecified trimester: Secondary | ICD-10-CM

## 2013-04-22 DIAGNOSIS — Z3480 Encounter for supervision of other normal pregnancy, unspecified trimester: Secondary | ICD-10-CM

## 2013-04-22 DIAGNOSIS — Z3483 Encounter for supervision of other normal pregnancy, third trimester: Secondary | ICD-10-CM

## 2013-04-22 DIAGNOSIS — O09523 Supervision of elderly multigravida, third trimester: Secondary | ICD-10-CM

## 2013-04-22 LAB — POCT URINALYSIS DIPSTICK
Blood, UA: NEGATIVE
Glucose, UA: NEGATIVE
Nitrite, UA: NEGATIVE
Urobilinogen, UA: NEGATIVE

## 2013-04-22 NOTE — Progress Notes (Signed)
Pulse: 92

## 2013-04-26 ENCOUNTER — Ambulatory Visit (HOSPITAL_COMMUNITY): Payer: Medicaid Other

## 2013-05-06 ENCOUNTER — Encounter: Payer: Self-pay | Admitting: Obstetrics

## 2013-05-06 ENCOUNTER — Ambulatory Visit (INDEPENDENT_AMBULATORY_CARE_PROVIDER_SITE_OTHER): Payer: Medicaid Other | Admitting: Obstetrics

## 2013-05-06 VITALS — BP 125/83 | Temp 97.8°F | Wt 174.0 lb

## 2013-05-06 DIAGNOSIS — Z3480 Encounter for supervision of other normal pregnancy, unspecified trimester: Secondary | ICD-10-CM

## 2013-05-06 DIAGNOSIS — Z3483 Encounter for supervision of other normal pregnancy, third trimester: Secondary | ICD-10-CM

## 2013-05-06 LAB — POCT URINALYSIS DIPSTICK: Spec Grav, UA: 1.015

## 2013-05-06 NOTE — Progress Notes (Signed)
Pulse: 90

## 2013-05-13 ENCOUNTER — Encounter: Payer: Self-pay | Admitting: Obstetrics

## 2013-05-13 ENCOUNTER — Ambulatory Visit (INDEPENDENT_AMBULATORY_CARE_PROVIDER_SITE_OTHER): Payer: Medicaid Other | Admitting: Obstetrics

## 2013-05-13 VITALS — BP 128/84 | Temp 97.9°F | Wt 176.0 lb

## 2013-05-13 DIAGNOSIS — Z3483 Encounter for supervision of other normal pregnancy, third trimester: Secondary | ICD-10-CM

## 2013-05-13 DIAGNOSIS — O0990 Supervision of high risk pregnancy, unspecified, unspecified trimester: Secondary | ICD-10-CM

## 2013-05-13 DIAGNOSIS — Z3480 Encounter for supervision of other normal pregnancy, unspecified trimester: Secondary | ICD-10-CM

## 2013-05-13 LAB — POCT URINALYSIS DIPSTICK
Blood, UA: NEGATIVE
Ketones, UA: NEGATIVE
Protein, UA: NEGATIVE
pH, UA: 6

## 2013-05-13 NOTE — Progress Notes (Signed)
Pulse: 91

## 2013-05-21 ENCOUNTER — Ambulatory Visit (INDEPENDENT_AMBULATORY_CARE_PROVIDER_SITE_OTHER): Payer: Medicaid Other | Admitting: Obstetrics

## 2013-05-21 ENCOUNTER — Encounter: Payer: Self-pay | Admitting: Obstetrics

## 2013-05-21 VITALS — BP 143/88 | Temp 97.0°F | Wt 177.0 lb

## 2013-05-21 DIAGNOSIS — Z3483 Encounter for supervision of other normal pregnancy, third trimester: Secondary | ICD-10-CM

## 2013-05-21 DIAGNOSIS — Z3480 Encounter for supervision of other normal pregnancy, unspecified trimester: Secondary | ICD-10-CM

## 2013-05-21 LAB — POCT URINALYSIS DIPSTICK
Bilirubin, UA: NEGATIVE
Blood, UA: NEGATIVE
Glucose, UA: NEGATIVE
Ketones, UA: NEGATIVE
Spec Grav, UA: 1.015

## 2013-05-21 NOTE — Progress Notes (Signed)
Pulse: 85

## 2013-05-25 ENCOUNTER — Inpatient Hospital Stay (HOSPITAL_COMMUNITY)
Admission: AD | Admit: 2013-05-25 | Discharge: 2013-05-25 | Disposition: A | Discharge status: Home / Self Care | Payer: BC Managed Care – PPO | Source: Ambulatory Visit | Attending: Obstetrics | Admitting: Obstetrics

## 2013-05-25 ENCOUNTER — Encounter (HOSPITAL_COMMUNITY): Payer: Self-pay | Admitting: *Deleted

## 2013-05-25 DIAGNOSIS — O4700 False labor before 37 completed weeks of gestation, unspecified trimester: Secondary | ICD-10-CM | POA: Insufficient documentation

## 2013-05-25 LAB — COMPREHENSIVE METABOLIC PANEL
CO2: 22 mEq/L (ref 19–32)
Calcium: 8.7 mg/dL (ref 8.4–10.5)
Creatinine, Ser: 0.49 mg/dL — ABNORMAL LOW (ref 0.50–1.10)
GFR calc Af Amer: >90 mL/min (ref >90)
GFR calc non Af Amer: >90 mL/min (ref >90)
Glucose, Bld: 92 mg/dL (ref 70–99)
Total Bilirubin: 0.5 mg/dL (ref 0.3–1.2)

## 2013-05-25 LAB — CBC
HCT: 35.7 % — ABNORMAL LOW (ref 36.0–46.0)
Hemoglobin: 12 g/dL (ref 12.0–15.0)
MCH: 26 pg (ref 26.0–34.0)
MCV: 77.4 fL — ABNORMAL LOW (ref 78.0–100.0)
RBC: 4.61 MIL/uL (ref 3.87–5.11)

## 2013-05-25 LAB — LACTATE DEHYDROGENASE: LDH: 245 U/L (ref 94–250)

## 2013-05-25 NOTE — MAU Note (Signed)
Pt presents with complaints of contractions that started at 3 this morning. Denies any bleeding or leakage of fluid

## 2013-05-25 NOTE — MAU Note (Signed)
Rates pain 4/10, began at 0330 this am, no bleeding or lof.

## 2013-05-28 ENCOUNTER — Encounter: Payer: Self-pay | Admitting: Obstetrics

## 2013-05-28 ENCOUNTER — Ambulatory Visit (INDEPENDENT_AMBULATORY_CARE_PROVIDER_SITE_OTHER): Payer: BC Managed Care – PPO | Admitting: Obstetrics

## 2013-05-28 VITALS — BP 147/86 | Temp 98.0°F | Wt 174.0 lb

## 2013-05-28 DIAGNOSIS — Z3480 Encounter for supervision of other normal pregnancy, unspecified trimester: Secondary | ICD-10-CM

## 2013-05-28 DIAGNOSIS — Z3483 Encounter for supervision of other normal pregnancy, third trimester: Secondary | ICD-10-CM

## 2013-05-28 LAB — POCT URINALYSIS DIPSTICK
Blood, UA: NEGATIVE
Ketones, UA: NEGATIVE
Nitrite, UA: NEGATIVE
Protein, UA: NEGATIVE
pH, UA: 5

## 2013-05-28 NOTE — Progress Notes (Signed)
P 101 Patient reports she went to the MAU last Saturday.

## 2013-06-03 ENCOUNTER — Ambulatory Visit (INDEPENDENT_AMBULATORY_CARE_PROVIDER_SITE_OTHER): Payer: BC Managed Care – PPO | Admitting: Obstetrics

## 2013-06-03 ENCOUNTER — Encounter: Payer: Medicaid Other | Admitting: Obstetrics

## 2013-06-03 VITALS — BP 137/88 | Temp 98.5°F | Wt 175.0 lb

## 2013-06-03 DIAGNOSIS — Z3480 Encounter for supervision of other normal pregnancy, unspecified trimester: Secondary | ICD-10-CM

## 2013-06-03 DIAGNOSIS — Z3483 Encounter for supervision of other normal pregnancy, third trimester: Secondary | ICD-10-CM

## 2013-06-03 LAB — POCT URINALYSIS DIPSTICK
Bilirubin, UA: NEGATIVE
Blood, UA: NEGATIVE
Nitrite, UA: NEGATIVE
Spec Grav, UA: 1.01
pH, UA: 6

## 2013-06-03 NOTE — Progress Notes (Signed)
HR - 92 Routine OB visit, denies any new concerns at this time.

## 2013-06-03 NOTE — Progress Notes (Signed)
Reactive NST 

## 2013-06-04 ENCOUNTER — Telehealth (HOSPITAL_COMMUNITY): Payer: Self-pay | Admitting: *Deleted

## 2013-06-04 ENCOUNTER — Other Ambulatory Visit: Payer: Self-pay | Admitting: *Deleted

## 2013-06-04 ENCOUNTER — Encounter: Payer: Self-pay | Admitting: Obstetrics

## 2013-06-04 NOTE — Telephone Encounter (Signed)
Preadmission screen Interpreter number [phone]

## 2013-06-05 ENCOUNTER — Encounter: Payer: Self-pay | Admitting: Obstetrics

## 2013-06-10 ENCOUNTER — Encounter: Payer: BC Managed Care – PPO | Admitting: Obstetrics

## 2013-06-10 ENCOUNTER — Inpatient Hospital Stay (HOSPITAL_COMMUNITY)
Admission: AD | Admit: 2013-06-10 | Discharge: 2013-06-12 | DRG: 775 | Disposition: A | Discharge status: Home / Self Care | Payer: BC Managed Care – PPO | Source: Ambulatory Visit | Attending: Obstetrics & Gynecology | Admitting: Obstetrics & Gynecology

## 2013-06-10 ENCOUNTER — Ambulatory Visit (INDEPENDENT_AMBULATORY_CARE_PROVIDER_SITE_OTHER): Payer: BC Managed Care – PPO | Admitting: Obstetrics & Gynecology

## 2013-06-10 ENCOUNTER — Encounter (HOSPITAL_COMMUNITY): Payer: Self-pay | Admitting: *Deleted

## 2013-06-10 ENCOUNTER — Inpatient Hospital Stay (HOSPITAL_COMMUNITY): Admission: RE | Admit: 2013-06-10 | Payer: Medicaid Other | Source: Ambulatory Visit

## 2013-06-10 VITALS — BP 130/89 | Temp 98.6°F | Wt 179.0 lb

## 2013-06-10 DIAGNOSIS — O289 Unspecified abnormal findings on antenatal screening of mother: Secondary | ICD-10-CM

## 2013-06-10 DIAGNOSIS — O139 Gestational [pregnancy-induced] hypertension without significant proteinuria, unspecified trimester: Principal | ICD-10-CM | POA: Diagnosis present

## 2013-06-10 DIAGNOSIS — Z3480 Encounter for supervision of other normal pregnancy, unspecified trimester: Secondary | ICD-10-CM

## 2013-06-10 DIAGNOSIS — Z3483 Encounter for supervision of other normal pregnancy, third trimester: Secondary | ICD-10-CM

## 2013-06-10 DIAGNOSIS — O133 Gestational [pregnancy-induced] hypertension without significant proteinuria, third trimester: Secondary | ICD-10-CM | POA: Diagnosis present

## 2013-06-10 LAB — POCT URINALYSIS DIPSTICK
Bilirubin, UA: NEGATIVE
Nitrite, UA: NEGATIVE
Spec Grav, UA: 1.025
Urobilinogen, UA: NEGATIVE
pH, UA: 5

## 2013-06-10 LAB — URINALYSIS, ROUTINE W REFLEX MICROSCOPIC
Glucose, UA: NEGATIVE mg/dL
Ketones, ur: NEGATIVE mg/dL
Leukocytes, UA: NEGATIVE
Nitrite: NEGATIVE
Specific Gravity, Urine: 1.025 (ref 1.005–1.030)
pH: 6 (ref 5.0–8.0)

## 2013-06-10 LAB — TYPE AND SCREEN
ABO/RH(D): O POS
Antibody Screen: NEGATIVE

## 2013-06-10 LAB — CBC WITH DIFFERENTIAL/PLATELET
HCT: 35 % — ABNORMAL LOW (ref 36.0–46.0)
Hemoglobin: 11.8 g/dL — ABNORMAL LOW (ref 12.0–15.0)
Lymphocytes Relative: 22 % (ref 12–46)
Lymphs Abs: 2.3 10*3/uL (ref 0.7–4.0)
Monocytes Absolute: 0.7 10*3/uL (ref 0.1–1.0)
Monocytes Relative: 7 % (ref 3–12)
Neutro Abs: 7.2 10*3/uL (ref 1.7–7.7)
Neutrophils Relative %: 71 % (ref 43–77)
RBC: 4.59 MIL/uL (ref 3.87–5.11)

## 2013-06-10 LAB — COMPREHENSIVE METABOLIC PANEL
ALT: 12 U/L (ref 0–35)
AST: 17 U/L (ref 0–37)
Alkaline Phosphatase: 168 U/L — ABNORMAL HIGH (ref 39–117)
BUN: 10 mg/dL (ref 6–23)
CO2: 20 mEq/L (ref 19–32)
Chloride: 101 mEq/L (ref 96–112)
GFR calc Af Amer: >90 mL/min (ref >90)
Glucose, Bld: 111 mg/dL — ABNORMAL HIGH (ref 70–99)
Potassium: 3.8 mEq/L (ref 3.5–5.1)
Sodium: 133 mEq/L — ABNORMAL LOW (ref 135–145)
Total Bilirubin: 0.2 mg/dL — ABNORMAL LOW (ref 0.3–1.2)
Total Protein: 6.3 g/dL (ref 6.0–8.3)

## 2013-06-10 LAB — URINE MICROSCOPIC-ADD ON

## 2013-06-10 LAB — LACTATE DEHYDROGENASE: LDH: 137 U/L (ref 94–250)

## 2013-06-10 MED ORDER — IBUPROFEN 600 MG PO TABS: 600.0000 mg | ORAL_TABLET | Freq: Four times a day (QID) | ORAL | Status: DC | PRN

## 2013-06-10 MED ORDER — PRENATAL MULTIVITAMIN CH
1.0000 | ORAL_TABLET | Freq: Every day | ORAL | Status: DC
  Administered 2013-06-11 – 2013-06-12 (×2): 1 via ORAL
  Filled 2013-06-10 (×2): qty 1

## 2013-06-10 MED ORDER — CITRIC ACID-SODIUM CITRATE 334-500 MG/5ML PO SOLN: 30.0000 mL | ORAL | Status: DC | PRN

## 2013-06-10 MED ORDER — DIPHENHYDRAMINE HCL 25 MG PO CAPS: 25.0000 mg | ORAL_CAPSULE | Freq: Four times a day (QID) | ORAL | Status: DC | PRN

## 2013-06-10 MED ORDER — LIDOCAINE HCL (PF) 1 % IJ SOLN: 30.0000 mL | INTRAMUSCULAR | Status: DC | PRN

## 2013-06-10 MED ORDER — ACETAMINOPHEN 325 MG PO TABS: 650.0000 mg | ORAL_TABLET | ORAL | Status: DC | PRN

## 2013-06-10 MED ORDER — HYDROXYZINE HCL 50 MG PO TABS: 50.0000 mg | ORAL_TABLET | Freq: Four times a day (QID) | ORAL | Status: DC | PRN

## 2013-06-10 MED ORDER — LACTATED RINGERS IV SOLN: INTRAVENOUS | Status: DC

## 2013-06-10 MED ORDER — LANOLIN HYDROUS EX OINT: TOPICAL_OINTMENT | CUTANEOUS | Status: DC | PRN

## 2013-06-10 MED ORDER — OXYTOCIN 40 UNITS IN LACTATED RINGERS INFUSION - SIMPLE MED: 1.0000 m[IU]/min | INTRAVENOUS | Status: DC

## 2013-06-10 MED ORDER — ONDANSETRON HCL 4 MG/2ML IJ SOLN: 4.0000 mg | Freq: Four times a day (QID) | INTRAMUSCULAR | Status: DC | PRN

## 2013-06-10 MED ORDER — FERROUS SULFATE 325 (65 FE) MG PO TABS
325.0000 mg | ORAL_TABLET | Freq: Two times a day (BID) | ORAL | Status: DC
  Administered 2013-06-11 – 2013-06-12 (×3): 325 mg via ORAL
  Filled 2013-06-10 (×3): qty 1

## 2013-06-10 MED ORDER — BUTORPHANOL TARTRATE 1 MG/ML IJ SOLN: 1.0000 mg | INTRAMUSCULAR | Status: DC | PRN

## 2013-06-10 MED ORDER — OXYTOCIN 40 UNITS IN LACTATED RINGERS INFUSION - SIMPLE MED: 62.5000 mL/h | INTRAVENOUS | Status: DC

## 2013-06-10 MED ORDER — IBUPROFEN 600 MG PO TABS
600.0000 mg | ORAL_TABLET | Freq: Four times a day (QID) | ORAL | Status: DC
  Administered 2013-06-11 – 2013-06-12 (×7): 600 mg via ORAL
  Filled 2013-06-10 (×7): qty 1

## 2013-06-10 MED ORDER — TERBUTALINE SULFATE 1 MG/ML IJ SOLN: 0.2500 mg | Freq: Once | INTRAMUSCULAR | Status: DC | PRN

## 2013-06-10 MED ORDER — ZOLPIDEM TARTRATE 5 MG PO TABS: 5.0000 mg | ORAL_TABLET | Freq: Every evening | ORAL | Status: DC | PRN

## 2013-06-10 MED ORDER — SENNOSIDES-DOCUSATE SODIUM 8.6-50 MG PO TABS
2.0000 | ORAL_TABLET | ORAL | Status: DC
  Administered 2013-06-11 (×2): 2 via ORAL
  Filled 2013-06-10 (×2): qty 2

## 2013-06-10 MED ORDER — LACTATED RINGERS IV SOLN: 500.0000 mL | INTRAVENOUS | Status: DC | PRN

## 2013-06-10 MED ORDER — OXYCODONE-ACETAMINOPHEN 5-325 MG PO TABS: 1.0000 | ORAL_TABLET | ORAL | Status: DC | PRN

## 2013-06-10 MED ORDER — LACTATED RINGERS IV SOLN
INTRAVENOUS | Status: DC
  Administered 2013-06-10: 16:00:00 via INTRAUTERINE

## 2013-06-10 MED ORDER — ONDANSETRON HCL 4 MG PO TABS: 4.0000 mg | ORAL_TABLET | ORAL | Status: DC | PRN

## 2013-06-10 MED ORDER — OXYTOCIN BOLUS FROM INFUSION: 500.0000 mL | INTRAVENOUS | Status: DC

## 2013-06-10 MED ORDER — LACTATED RINGERS IV SOLN
INTRAVENOUS | Status: DC
  Administered 2013-06-10: 14:00:00 via INTRAVENOUS

## 2013-06-10 MED ORDER — BENZOCAINE-MENTHOL 20-0.5 % EX AERO: 1.0000 "application " | INHALATION_SPRAY | CUTANEOUS | Status: DC | PRN

## 2013-06-10 MED ORDER — DIBUCAINE 1 % RE OINT: 1.0000 "application " | TOPICAL_OINTMENT | RECTAL | Status: DC | PRN

## 2013-06-10 MED ORDER — SIMETHICONE 80 MG PO CHEW: 80.0000 mg | CHEWABLE_TABLET | ORAL | Status: DC | PRN

## 2013-06-10 MED ORDER — LIDOCAINE HCL (PF) 1 % IJ SOLN
30.0000 mL | INTRAMUSCULAR | Status: DC | PRN
  Filled 2013-06-10 (×2): qty 30

## 2013-06-10 MED ORDER — TETANUS-DIPHTH-ACELL PERTUSSIS 5-2.5-18.5 LF-MCG/0.5 IM SUSP: 0.5000 mL | Freq: Once | INTRAMUSCULAR | Status: DC

## 2013-06-10 MED ORDER — FLEET ENEMA 7-19 GM/118ML RE ENEM: 1.0000 | ENEMA | RECTAL | Status: DC | PRN

## 2013-06-10 MED ORDER — OXYTOCIN 40 UNITS IN LACTATED RINGERS INFUSION - SIMPLE MED
1.0000 m[IU]/min | INTRAVENOUS | Status: DC
  Administered 2013-06-10: 2 m[IU]/min via INTRAVENOUS
  Filled 2013-06-10: qty 1000

## 2013-06-10 MED ORDER — ONDANSETRON HCL 4 MG/2ML IJ SOLN: 4.0000 mg | INTRAMUSCULAR | Status: DC | PRN

## 2013-06-10 MED ORDER — MISOPROSTOL 25 MCG QUARTER TABLET: 25.0000 ug | ORAL_TABLET | ORAL | Status: DC | PRN

## 2013-06-10 MED ORDER — OXYTOCIN BOLUS FROM INFUSION
500.0000 mL | INTRAVENOUS | Status: DC
  Administered 2013-06-10: 500 mL via INTRAVENOUS

## 2013-06-10 MED ORDER — INFLUENZA VAC SPLIT QUAD 0.5 ML IM SUSP
0.5000 mL | INTRAMUSCULAR | Status: AC
  Administered 2013-06-11: 0.5 mL via INTRAMUSCULAR
  Filled 2013-06-10: qty 0.5

## 2013-06-10 MED ORDER — WITCH HAZEL-GLYCERIN EX PADS: 1.0000 "application " | MEDICATED_PAD | CUTANEOUS | Status: DC | PRN

## 2013-06-10 NOTE — MAU Note (Signed)
Pt was sent from Dr. Marcia Brash office for [facility] evaluation and Fetal monitoring for some variables in office.   Pt has an induction scheduled for tonight.  Pt reports good fetal movement.  Denies vaginal bleeding or ROM.

## 2013-06-10 NOTE — H&P (Signed)
[name] is a 37 y.o. female presenting for IOL. Maternal Medical History:  Reason for admission: Positive non-stress test in the office today with variable decelerations.  She is being followed with fetal testing for gestational hypertension.  Fetal activity: Perceived fetal activity is normal.    Prenatal complications: Gestational hypertension  Prenatal Complications - Diabetes: none.    OB History   Grav Para Term Preterm Abortions TAB SAB Ect Mult Living   6 3 3  2  2   3      Past Medical History  Diagnosis Date  . PIH (pregnancy induced hypertension)    Past Surgical History  Procedure Laterality Date  . No past surgeries     Family History: family history includes Diabetes in her father; Hypertension in her mother. Social History:  reports that she has never smoked. She has never used smokeless tobacco. She reports that she does not drink alcohol or use illicit drugs.     Review of Systems  Constitutional: Negative for fever.  Eyes: Negative for blurred vision.  Respiratory: Negative for shortness of breath.   Gastrointestinal: Negative for vomiting.  Skin: Negative for rash.  Neurological: Negative for headaches.    Dilation: 3 Effacement (%): 80 Station: -2 Exam by:: Jackson-moore Blood pressure 118/66, pulse 83, temperature 98.5 F (36.9 C), temperature source Oral, resp. rate 18, height 5\' 2"  (1.575 m), weight 80.74 kg (178 lb), last menstrual period 08/22/2012, SpO2 99.00%. Maternal Exam:  Abdomen: Patient reports no abdominal tenderness. Fetal presentation: vertex  Introitus: Normal vulva. Amniotic fluid character: clear.  Pelvis: adequate for delivery.   Cervix: Cervix evaluated by digital exam.     Fetal Exam Fetal Monitor Review: Variability: moderate (6-25 bpm).   Pattern: accelerations present and variable decelerations.    Fetal State Assessment: Category II - tracings are indeterminate.     Physical Exam  Constitutional: She  appears well-developed.  HENT:  Head: Normocephalic.  Neck: Neck supple. No thyromegaly present.  Cardiovascular: Normal rate and regular rhythm.   Respiratory: Breath sounds normal.  GI: Soft. Bowel sounds are normal.  Skin: No rash noted.    Prenatal labs: ABO, Rh: O/POS/-- (04/30 1347) Antibody: NEG (04/30 1347) Rubella: 6.00 (04/30 1347) RPR: NON REAC (08/07 1112)  HBsAg: NEGATIVE (04/30 1347)  HIV: NON REACTIVE (08/07 1112)  GBS: NEGATIVE (10/13 1401)  Results for orders placed during the hospital encounter of 06/10/13 (from the past 24 hour(s))  URINALYSIS, ROUTINE W REFLEX MICROSCOPIC     Status: Abnormal   Collection Time    06/10/13 12:20 PM      Result Value Range   Color, Urine YELLOW  YELLOW   APPearance CLEAR  CLEAR   Specific Gravity, Urine 1.025  1.005 - 1.030   pH 6.0  5.0 - 8.0   Glucose, UA NEGATIVE  NEGATIVE mg/dL   Hgb urine dipstick TRACE (*) NEGATIVE   Bilirubin Urine NEGATIVE  NEGATIVE   Ketones, ur NEGATIVE  NEGATIVE mg/dL   Protein, ur NEGATIVE  NEGATIVE mg/dL   Urobilinogen, UA 0.2  0.0 - 1.0 mg/dL   Nitrite NEGATIVE  NEGATIVE   Leukocytes, UA NEGATIVE  NEGATIVE  PROTEIN / CREATININE RATIO, URINE     Status: None   Collection Time    06/10/13 12:20 PM      Result Value Range   Creatinine, Urine 135.96     Total Protein, Urine 12.8     PROTEIN CREATININE RATIO 0.09  0.00 - 0.15  URINE  MICROSCOPIC-ADD ON     Status: Abnormal   Collection Time    06/10/13 12:20 PM      Result Value Range   Squamous Epithelial / LPF FEW (*) RARE   WBC, UA 3-6  <3 WBC/hpf   RBC / HPF 0-2  <3 RBC/hpf   Bacteria, UA FEW (*) RARE  CBC WITH DIFFERENTIAL     Status: Abnormal   Collection Time    06/10/13 12:40 PM      Result Value Range   WBC 10.3  4.0 - 10.5 K/uL   RBC 4.59  3.87 - 5.11 MIL/uL   Hemoglobin 11.8 (*) 12.0 - 15.0 g/dL   HCT 62.1 (*) 30.8 - 65.7 %   MCV 76.3 (*) 78.0 - 100.0 fL   MCH 25.7 (*) 26.0 - 34.0 pg   MCHC 33.7  30.0 - 36.0 g/dL   RDW  84.6  96.2 - 95.2 %   Platelets 255  150 - 400 K/uL   Neutrophils Relative % 71  43 - 77 %   Neutro Abs 7.2  1.7 - 7.7 K/uL   Lymphocytes Relative 22  12 - 46 %   Lymphs Abs 2.3  0.7 - 4.0 K/uL   Monocytes Relative 7  3 - 12 %   Monocytes Absolute 0.7  0.1 - 1.0 K/uL   Eosinophils Relative 0  0 - 5 %   Eosinophils Absolute 0.0  0.0 - 0.7 K/uL   Basophils Relative 0  0 - 1 %   Basophils Absolute 0.0  0.0 - 0.1 K/uL  LACTATE DEHYDROGENASE     Status: None   Collection Time    06/10/13 12:40 PM      Result Value Range   LDH 137  94 - 250 U/L  COMPREHENSIVE METABOLIC PANEL     Status: Abnormal   Collection Time    06/10/13 12:40 PM      Result Value Range   Sodium 133 (*) 135 - 145 mEq/L   Potassium 3.8  3.5 - 5.1 mEq/L   Chloride 101  96 - 112 mEq/L   CO2 20  19 - 32 mEq/L   Glucose, Bld 111 (*) 70 - 99 mg/dL   BUN 10  6 - 23 mg/dL   Creatinine, Ser 8.41 (*) 0.50 - 1.10 mg/dL   Calcium 9.2  8.4 - 32.4 mg/dL   Total Protein 6.3  6.0 - 8.3 g/dL   Albumin 2.6 (*) 3.5 - 5.2 g/dL   AST 17  0 - 37 U/L   ALT 12  0 - 35 U/L   Alkaline Phosphatase 168 (*) 39 - 117 U/L   Total Bilirubin 0.2 (*) 0.3 - 1.2 mg/dL   GFR calc non Af Amer >90  >90 mL/min   GFR calc Af Amer >90  >90 mL/min   Assessment/Plan: Multipara @ [redacted]w[redacted]d.  Gestational HTN.  Category II FHT--LTV/accelerations c/w fetal well-being  Amnioinfusion Low dose Pitocin per protocol Monitor closely Anticipate SVD   JACKSON-MOORE,Davionne Dowty A 06/10/2013, 4:08 PM

## 2013-06-10 NOTE — Progress Notes (Signed)
HR - 106 Pt  In office for routine OB visit, denies any concerns at this time.  NST with a moderate variable deceleration.  To [facility] for admission.

## 2013-06-11 LAB — CBC
Hemoglobin: 11 g/dL — ABNORMAL LOW (ref 12.0–15.0)
MCH: 25.2 pg — ABNORMAL LOW (ref 26.0–34.0)
MCHC: 33.2 g/dL (ref 30.0–36.0)
MCV: 75.9 fL — ABNORMAL LOW (ref 78.0–100.0)
RBC: 4.36 MIL/uL (ref 3.87–5.11)
RDW: 13.4 % (ref 11.5–15.5)

## 2013-06-11 LAB — URINE CULTURE: Culture: NO GROWTH

## 2013-06-11 NOTE — Progress Notes (Signed)
Post Partum Day 1 Subjective: no complaints, up ad lib, voiding, tolerating PO and + flatus  Objective: Blood pressure 133/85, pulse 88, temperature 97.6 F (36.4 C), temperature source Oral, resp. rate 20, height 5\' 2"  (1.575 m), weight 178 lb (80.74 kg), last menstrual period 08/22/2012, SpO2 99.00%, unknown if currently breastfeeding.  Physical Exam:  General: alert Lochia: appropriate Uterine Fundus: firm Incision: NA DVT Evaluation: No evidence of DVT seen on physical exam.   Recent Labs  06/10/13 1240 06/11/13 0555  HGB 11.8* 11.0*  HCT 35.0* 33.1*    Assessment/Plan: Plan for discharge tomorrow, Breastfeeding and Contraception Micronor   LOS: 1 day   [name] 06/11/2013, 9:45 AM

## 2013-06-11 NOTE — Progress Notes (Signed)
UR chart review completed.  

## 2013-06-12 DIAGNOSIS — O133 Gestational [pregnancy-induced] hypertension without significant proteinuria, third trimester: Secondary | ICD-10-CM | POA: Diagnosis present

## 2013-06-12 LAB — RPR: RPR Ser Ql: NONREACTIVE

## 2013-06-12 MED ORDER — OXYCODONE-ACETAMINOPHEN 5-325 MG PO TABS: 1.0000 | ORAL_TABLET | ORAL | Status: DC | PRN

## 2013-06-12 MED ORDER — NORETHINDRONE 0.35 MG PO TABS: 1.0000 | ORAL_TABLET | Freq: Every day | ORAL | Status: DC

## 2013-06-12 NOTE — Discharge Summary (Signed)
Obstetric Discharge Summary Reason for Admission: induction of labor Prenatal Procedures: none Intrapartum Procedures: spontaneous vaginal delivery Postpartum Procedures: none Complications-Operative and Postpartum: none Hemoglobin  Date Value Range Status  06/11/2013 11.0* 12.0 - 15.0 g/dL Final     HCT  Date Value Range Status  06/11/2013 33.1* 36.0 - 46.0 % Final    Physical Exam:  General: alert Lochia: appropriate Uterine Fundus: firm Incision: n/a DVT Evaluation: No evidence of DVT seen on physical exam.  Discharge Diagnoses: Active Problems:   Gestational hypertension w/o significant proteinuria in 3rd trimester   Normal delivery    Discharge Information: Date: 06/12/2013 Activity: pelvic rest Diet: routine Medications: Percocet and Micronor Condition: stable Instructions: see above Discharge to: home Follow-up Information   Follow up with HARPER,CHARLES A, MD. Schedule an appointment as soon as possible for a visit in 2 days.   Specialty:  Obstetrics and Gynecology   Contact information:   [address] [place] [address] [phone]       Newborn Data: Live born female  Birth Weight: 6 lb 8.4 oz (2960 g) APGAR: 9, 9  Home with mother.  JACKSON-MOORE,Noha Karasik A 06/12/2013, 8:17 AM

## 2013-07-30 ENCOUNTER — Ambulatory Visit (INDEPENDENT_AMBULATORY_CARE_PROVIDER_SITE_OTHER): Payer: BC Managed Care – PPO | Admitting: Obstetrics

## 2013-07-30 ENCOUNTER — Encounter: Payer: Self-pay | Admitting: Obstetrics

## 2013-07-30 MED ORDER — PRENATAL PLUS 27-1 MG PO TABS: 30.0000 | ORAL_TABLET | Freq: Every day | ORAL | Status: DC

## 2013-07-30 NOTE — Progress Notes (Signed)
Subjective:     [name] is a 37 y.o. female who presents for a postpartum visit. She is 7 weeks postpartum following a spontaneous vaginal delivery. I have fully reviewed the prenatal and intrapartum course. The delivery was at 39 gestational weeks. Outcome: spontaneous vaginal delivery. Anesthesia: none. Postpartum course has been normal. Baby's course has been normal. Baby is feeding by breast and bottle gerber formula used. Bleeding no bleeding. Bowel function is normal. Bladder function is normal. Patient is not sexually active. Contraception method is abstinence and OCP (estrogen/progesterone). Postpartum depression screening: score 3 mild depression.  The following portions of the patient's history were reviewed and updated as appropriate: allergies, current medications, past family history, past medical history, past social history, past surgical history and problem list.  Review of Systems Pertinent items are noted in HPI.   Objective:    BP 128/85  Pulse 86  Temp(Src) 97.8 F (36.6 C)  Ht 5\' 3"  (1.6 m)  Wt 161 lb (73.029 kg)  BMI 28.53 kg/m2  Breastfeeding? No  General:  alert and no distress   Breasts:  inspection negative, no nipple discharge or bleeding, no masses or nodularity palpable Abdomen:  Soft, NT. Pelvic:  NEFG.  Uterus NSSC, NT.   Assessment:     Normal postpartum exam. Pap smear not done at today's visit.   Plan:    1. Contraception: oral progesterone-only contraceptive 2. Continue PNV's 3. Follow up in: several months or as needed.

## 2014-06-02 ENCOUNTER — Encounter: Payer: Self-pay | Admitting: Obstetrics

## 2014-06-05 ENCOUNTER — Encounter: Payer: Self-pay | Admitting: Obstetrics

## 2014-06-05 ENCOUNTER — Other Ambulatory Visit: Payer: Self-pay | Admitting: Obstetrics

## 2014-06-05 ENCOUNTER — Ambulatory Visit (INDEPENDENT_AMBULATORY_CARE_PROVIDER_SITE_OTHER): Payer: Medicaid Other | Admitting: Obstetrics

## 2014-06-05 VITALS — BP 124/88 | HR 91 | Temp 98.1°F | Ht 63.0 in | Wt 159.0 lb

## 2014-06-05 DIAGNOSIS — B373 Candidiasis of vulva and vagina: Secondary | ICD-10-CM

## 2014-06-05 DIAGNOSIS — B3731 Acute candidiasis of vulva and vagina: Secondary | ICD-10-CM

## 2014-06-05 DIAGNOSIS — Z0000 Encounter for general adult medical examination without abnormal findings: Secondary | ICD-10-CM

## 2014-06-05 DIAGNOSIS — Z3041 Encounter for surveillance of contraceptive pills: Secondary | ICD-10-CM

## 2014-06-05 MED ORDER — PRENATAL PLUS 27-1 MG PO TABS: 30.0000 | ORAL_TABLET | Freq: Every day | ORAL | Status: DC

## 2014-06-05 MED ORDER — TERCONAZOLE 0.4 % VA CREA: 1.0000 | TOPICAL_CREAM | Freq: Every day | VAGINAL | Status: DC

## 2014-06-05 MED ORDER — NORETHINDRONE 0.35 MG PO TABS: 1.0000 | ORAL_TABLET | Freq: Every day | ORAL | Status: DC

## 2014-06-05 NOTE — Progress Notes (Signed)
Subjective:     [name] is a 38 y.o. female here for a routine exam.  Current complaints: none.    Personal health questionnaire:  Is patient [name], have a family history of breast and/or ovarian cancer: no Is there a family history of uterine cancer diagnosed at age < 3750, gastrointestinal cancer, urinary tract cancer, family member who is a Personnel officerLynch syndrome-associated carrier: no Is the patient overweight and hypertensive, family history of diabetes, personal history of gestational diabetes or PCOS: no Is patient over 355, have PCOS,  family history of premature CHD under age 40, diabetes, smoke, have hypertension or peripheral artery disease:  no At any time, has a partner hit, kicked or otherwise hurt or frightened you?: no Over the past 2 weeks, have you felt down, depressed or hopeless?: no Over the past 2 weeks, have you felt little interest or pleasure in doing things?:no   Gynecologic History Patient's last menstrual period was 05/17/2014. Contraception: OCP Last Pap: 2014. Results were: normal Last mammogram: n/a. Results were: n/a  Obstetric History OB History  Gravida Para Term Preterm AB SAB TAB Ectopic Multiple Living  6 4 4  2 2    4     # Outcome Date GA Lbr Len/2nd Weight Sex Delivery Anes PTL Lv  6 Term 06/10/13 4557w3d 01:17 / 00:01 6 lb 8.4 oz (2.96 kg) M Vag-Spont None  Y     Comments: wnl  5 SAB 08/01/10 2226w0d         4 Term 08/06/08 4943w0d  6 lb (2.722 kg) M Vag-Spont None  Y     Comments: Hypertension  3 SAB 08/01/05 2434w0d         2 Term 12/22/03 6852w0d  8 lb (3.629 kg) F Vag-Spont None  Y     Comments: No complications  1 Term 04/25/98 4452w0d  7 lb 5 oz (3.317 kg) M Vag-Spont None  Y     Comments: No complications      Past Medical History  Diagnosis Date  . PIH (pregnancy induced hypertension)     Past Surgical History  Procedure Laterality Date  . No past surgeries      Current outpatient prescriptions: norethindrone (ORTHO MICRONOR)  0.35 MG tablet, Take 1 tablet (0.35 mg total) by mouth daily., Disp: 28 tablet, Rfl: 11 No Known Allergies  History  Substance Use Topics  . Smoking status: Never Smoker   . Smokeless tobacco: Never Used  . Alcohol Use: No    Family History  Problem Relation Age of Onset  . Hypertension Mother   . Diabetes Father       Review of Systems  Constitutional: negative for fatigue and weight loss Respiratory: negative for cough and wheezing Cardiovascular: negative for chest pain, fatigue and palpitations Gastrointestinal: negative for abdominal pain and change in bowel habits Musculoskeletal:negative for myalgias Neurological: negative for gait problems and tremors Behavioral/Psych: negative for abusive relationship, depression Endocrine: negative for temperature intolerance   Genitourinary:negative for abnormal menstrual periods, genital lesions, hot flashes, sexual problems and vaginal discharge Integument/breast: negative for breast lump, breast tenderness, nipple discharge and skin lesion(s)    Objective:       BP 124/88 mmHg  Pulse 91  Temp(Src) 98.1 F (36.7 C)  Ht 5\' 3"  (1.6 m)  Wt 159 lb (72.122 kg)  BMI 28.17 kg/m2  LMP 05/17/2014  Breastfeeding? Yes General:   alert  Skin:   no rash or abnormalities  Lungs:   clear to auscultation  bilaterally  Heart:   regular rate and rhythm, S1, S2 normal, no murmur, click, rub or gallop  Breasts:   normal without suspicious masses, skin or nipple changes or axillary nodes  Abdomen:  normal findings: no organomegaly, soft, non-tender and no hernia  Pelvis:  External genitalia: normal general appearance Urinary system: urethral meatus normal and bladder without fullness, nontender Vaginal: normal without tenderness, induration or masses Cervix: normal appearance Adnexa: normal bimanual exam Uterus: anteverted and non-tender, normal size   Lab Review Urine pregnancy test Labs reviewed yes Radiologic studies reviewed  no    Assessment:    Healthy female exam.    Plan:    Education reviewed: low fat, low cholesterol diet, weight bearing exercise and contraceptive options. Contraception: oral progesterone-only contraceptive. Follow up in: several months. Breast feeding recommended as long as possible   No orders of the defined types were placed in this encounter.   No orders of the defined types were placed in this encounter.

## 2014-06-06 LAB — WET PREP BY MOLECULAR PROBE
Candida species: NEGATIVE
Gardnerella vaginalis: NEGATIVE
TRICHOMONAS VAG: NEGATIVE

## 2014-06-06 LAB — GC/CHLAMYDIA PROBE AMP
CT PROBE, AMP APTIMA: NEGATIVE
GC Probe RNA: NEGATIVE

## 2014-06-09 LAB — PAP IG AND HPV HIGH-RISK: HPV DNA High Risk: NOT DETECTED

## 2014-06-23 ENCOUNTER — Other Ambulatory Visit (INDEPENDENT_AMBULATORY_CARE_PROVIDER_SITE_OTHER): Payer: Self-pay | Admitting: Surgery

## 2014-06-23 NOTE — H&P (Signed)
[name] 06/23/2014 2:06 PM Location: [facility] Patient #: [id number] DOB: [birth year] Married / Language: English / Race: Undefined Female History of Present Illness Sierra Fus([name] A. Sierra Tye MD; 06/23/2014 2:25 PM) Patient words: eval gallbladder  PT SENT DUE TO SYMPTOMATIC CHOLELITHIASIS AND EPIGASTR2IC ABDOMINAL PAIN AFTER EATING. mADE WORSE WITH FATS. SHARP AND LOCATION EPIGASTRIUM. U/S SHOWS GALLSTONES NO THICKENING AND NL CBD.  The patient is a 38 year old female who presents for evaluation of gall stones. The onset of the gall stones has been gradual and has been occurring in an intermittent pattern for 12 months. The course has been gradually worsening. The gall stones is described as moderate. There has been associated abdominal pain, back pain, burning pain and colicky pain. Precipitating factors include excessive fat intake. There are no relieving factors. Past Surgical History [name]([name]; 06/23/2014 2:08 PM) None 06/23/2014  Diagnostic Studies History [name]([name]; 06/23/2014 2:07 PM) Colonoscopy never Pap Smear never  Allergies [name]([name], [place]; 06/23/2014 2:07 PM) No Known Drug Allergies 06/23/2014  Medication History [name]([name], [place]; 06/23/2014 2:08 PM) [name] Micronor (0.35MG  Tablet, Oral) Active. Prenatal Multivit-Min-FA (1MG  Capsule, Oral) Active.  Pregnancy / Birth History [name]([name]; 06/23/2014 2:07 PM) Age at menarche 13 years. Gravida 5 Maternal age 40-25 Para 4 Regular periods     Review of Systems [name](Sierra R. Murphy CMA; 06/23/2014 2:07 PM) Gastrointestinal Present- Excessive gas. Not Present- Abdominal Pain, Bloating, Bloody Stool, Change in Bowel Habits, Chronic diarrhea, Constipation, Difficulty Swallowing, Gets full quickly at meals, Hemorrhoids, Indigestion, Nausea, Rectal Pain and Vomiting.  Vitals [employer](Sierra R. Murphy CMA; 06/23/2014 2:07 PM) 06/23/2014 2:06  PM Weight: 151.5 lb Height: 63in Body Surface Area: 1.75 m Body Mass Index: 26.84 kg/m BP: 122/80 (Sitting, Left Arm, Standard)     Physical Exam (Sierra Tidmore A. Sierra Switalski MD; 06/23/2014 2:21 PM)  General Mental Status-Alert. General Appearance-Consistent with stated age. Hydration-Well hydrated. Voice-Normal.  Head and Neck Head-normocephalic, atraumatic with no lesions or palpable masses.  Eye Eyeball - Bilateral-Extraocular movements intact. Sclera/Conjunctiva - Bilateral-No scleral icterus.  Chest and Lung Exam Chest and lung exam reveals -quiet, even and easy respiratory effort with no use of accessory muscles and on auscultation, normal breath sounds, no adventitious sounds and normal vocal resonance. Inspection Chest Wall - Normal. Back - normal.  Cardiovascular Cardiovascular examination reveals -on palpation PMI is normal in location and amplitude, no palpable S3 or S4. Normal cardiac borders., normal heart sounds, regular rate and rhythm with no murmurs, carotid auscultation reveals no bruits and normal pedal pulses bilaterally.  Abdomen Inspection Inspection of the abdomen reveals - No Hernias. Skin - Scar - no surgical scars. Palpation/Percussion Palpation and Percussion of the abdomen reveal - Soft, Non Tender, No Rebound tenderness, No Rigidity (guarding) and No hepatosplenomegaly. Auscultation Auscultation of the abdomen reveals - Bowel sounds normal.  Neurologic Neurologic evaluation reveals -alert and oriented x 3 with no impairment of recent or remote memory. Mental Status-Normal.  Musculoskeletal Normal Exam - Left-Upper Extremity Strength Normal and Lower Extremity Strength Normal. Normal Exam - Right-Upper Extremity Strength Normal, Lower Extremity Weakness.    Assessment & Plan ([name] A. 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 RECOMMEND LAPAROSCOPIC CHOLECYSTECTOMY AND CHOLANGIOGRAM. The procedure has been discussed with the patient. Risks of laparoscopic cholecystectomy include bleeding, infection, bile duct injury, leak, death, open surgery, diarrhea, other surgery, organ injury, blood vessel injury, DVT,  and additional care.  Current Plans Pt Education - [facility] Laparoscopic Surgery HCI Pt Education - Cholecystectomy: Gallbladder Removal with Open Surgery: laparoscopic cholecystectomy

## 2014-07-11 ENCOUNTER — Encounter (HOSPITAL_BASED_OUTPATIENT_CLINIC_OR_DEPARTMENT_OTHER): Payer: Self-pay | Admitting: *Deleted

## 2014-07-15 ENCOUNTER — Encounter (HOSPITAL_BASED_OUTPATIENT_CLINIC_OR_DEPARTMENT_OTHER)
Admission: RE | Admit: 2014-07-15 | Discharge: 2014-07-15 | Disposition: A | Discharge status: Home / Self Care | Payer: Medicaid Other | Source: Ambulatory Visit | Attending: Surgery | Admitting: Surgery

## 2014-07-15 ENCOUNTER — Encounter (HOSPITAL_BASED_OUTPATIENT_CLINIC_OR_DEPARTMENT_OTHER): Payer: Self-pay | Admitting: *Deleted

## 2014-07-15 DIAGNOSIS — K219 Gastro-esophageal reflux disease without esophagitis: Secondary | ICD-10-CM | POA: Diagnosis not present

## 2014-07-15 DIAGNOSIS — K8010 Calculus of gallbladder with chronic cholecystitis without obstruction: Secondary | ICD-10-CM | POA: Diagnosis not present

## 2014-07-15 DIAGNOSIS — K8020 Calculus of gallbladder without cholecystitis without obstruction: Secondary | ICD-10-CM | POA: Diagnosis present

## 2014-07-15 DIAGNOSIS — Z8249 Family history of ischemic heart disease and other diseases of the circulatory system: Secondary | ICD-10-CM | POA: Diagnosis not present

## 2014-07-15 DIAGNOSIS — Z833 Family history of diabetes mellitus: Secondary | ICD-10-CM | POA: Diagnosis not present

## 2014-07-15 LAB — COMPREHENSIVE METABOLIC PANEL
ALBUMIN: 4.1 g/dL (ref 3.5–5.2)
ALT: 17 U/L (ref 0–35)
ANION GAP: 14 (ref 5–15)
AST: 19 U/L (ref 0–37)
Alkaline Phosphatase: 89 U/L (ref 39–117)
BILIRUBIN TOTAL: 0.8 mg/dL (ref 0.3–1.2)
BUN: 10 mg/dL (ref 6–23)
CO2: 24 mEq/L (ref 19–32)
Calcium: 9.2 mg/dL (ref 8.4–10.5)
Chloride: 103 mEq/L (ref 96–112)
Creatinine, Ser: 0.58 mg/dL (ref 0.50–1.10)
GFR calc non Af Amer: >90 mL/min (ref >90)
Glucose, Bld: 103 mg/dL — ABNORMAL HIGH (ref 70–99)
Potassium: 3.6 mEq/L — ABNORMAL LOW (ref 3.7–5.3)
Sodium: 141 mEq/L (ref 137–147)
Total Protein: 7.6 g/dL (ref 6.0–8.3)

## 2014-07-15 LAB — CBC WITH DIFFERENTIAL/PLATELET
BASOS PCT: 0 % (ref 0–1)
Basophils Absolute: 0 10*3/uL (ref 0.0–0.1)
EOS ABS: 0 10*3/uL (ref 0.0–0.7)
Eosinophils Relative: 1 % (ref 0–5)
HEMATOCRIT: 39.2 % (ref 36.0–46.0)
HEMOGLOBIN: 12.8 g/dL (ref 12.0–15.0)
Lymphocytes Relative: 32 % (ref 12–46)
Lymphs Abs: 2.6 10*3/uL (ref 0.7–4.0)
MCH: 25.2 pg — AB (ref 26.0–34.0)
MCHC: 32.7 g/dL (ref 30.0–36.0)
MCV: 77.2 fL — AB (ref 78.0–100.0)
MONOS PCT: 4 % (ref 3–12)
Monocytes Absolute: 0.4 10*3/uL (ref 0.1–1.0)
Neutro Abs: 5 10*3/uL (ref 1.7–7.7)
Neutrophils Relative %: 63 % (ref 43–77)
Platelets: 276 10*3/uL (ref 150–400)
RBC: 5.08 MIL/uL (ref 3.87–5.11)
RDW: 13.2 % (ref 11.5–15.5)
WBC: 8 10*3/uL (ref 4.0–10.5)

## 2014-07-16 ENCOUNTER — Encounter (HOSPITAL_BASED_OUTPATIENT_CLINIC_OR_DEPARTMENT_OTHER): Payer: Self-pay | Admitting: *Deleted

## 2014-07-16 ENCOUNTER — Ambulatory Visit (HOSPITAL_BASED_OUTPATIENT_CLINIC_OR_DEPARTMENT_OTHER)
Admission: RE | Admit: 2014-07-16 | Discharge: 2014-07-16 | Disposition: A | Discharge status: Home / Self Care | Payer: Medicaid Other | Source: Ambulatory Visit | Attending: Surgery | Admitting: Surgery

## 2014-07-16 ENCOUNTER — Ambulatory Visit (HOSPITAL_BASED_OUTPATIENT_CLINIC_OR_DEPARTMENT_OTHER): Payer: Medicaid Other | Admitting: Certified Registered"

## 2014-07-16 ENCOUNTER — Encounter (HOSPITAL_BASED_OUTPATIENT_CLINIC_OR_DEPARTMENT_OTHER)
Admission: RE | Disposition: A | Discharge status: Home / Self Care | Payer: Self-pay | Source: Ambulatory Visit | Attending: Surgery

## 2014-07-16 DIAGNOSIS — Z833 Family history of diabetes mellitus: Secondary | ICD-10-CM | POA: Insufficient documentation

## 2014-07-16 DIAGNOSIS — K8010 Calculus of gallbladder with chronic cholecystitis without obstruction: Secondary | ICD-10-CM | POA: Diagnosis not present

## 2014-07-16 DIAGNOSIS — Z8249 Family history of ischemic heart disease and other diseases of the circulatory system: Secondary | ICD-10-CM | POA: Insufficient documentation

## 2014-07-16 DIAGNOSIS — K219 Gastro-esophageal reflux disease without esophagitis: Secondary | ICD-10-CM | POA: Diagnosis not present

## 2014-07-16 DIAGNOSIS — Z419 Encounter for procedure for purposes other than remedying health state, unspecified: Secondary | ICD-10-CM

## 2014-07-16 HISTORY — DX: Presence of spectacles and contact lenses: Z97.3

## 2014-07-16 HISTORY — PX: CHOLECYSTECTOMY: SHX55

## 2014-07-16 SURGERY — LAPAROSCOPIC CHOLECYSTECTOMY WITH INTRAOPERATIVE CHOLANGIOGRAM
Anesthesia: General | Site: Abdomen

## 2014-07-16 MED ORDER — SODIUM CHLORIDE 0.9 % IR SOLN
Status: DC | PRN
  Administered 2014-07-16: 1

## 2014-07-16 MED ORDER — ATROPINE SULFATE 0.4 MG/ML IJ SOLN
INTRAMUSCULAR | Status: DC | PRN
  Administered 2014-07-16: 0.4 mg via INTRAVENOUS

## 2014-07-16 MED ORDER — EPHEDRINE SULFATE 50 MG/ML IJ SOLN
INTRAMUSCULAR | Status: DC | PRN
  Administered 2014-07-16: 10 mg via INTRAVENOUS

## 2014-07-16 MED ORDER — OXYCODONE-ACETAMINOPHEN 5-325 MG PO TABS: 1.0000 | ORAL_TABLET | ORAL | Status: DC | PRN

## 2014-07-16 MED ORDER — SUCCINYLCHOLINE CHLORIDE 20 MG/ML IJ SOLN
INTRAMUSCULAR | Status: DC | PRN
  Administered 2014-07-16: 100 mg via INTRAVENOUS

## 2014-07-16 MED ORDER — FENTANYL CITRATE 0.05 MG/ML IJ SOLN: 50.0000 ug | INTRAMUSCULAR | Status: DC | PRN

## 2014-07-16 MED ORDER — PROPOFOL 10 MG/ML IV BOLUS
INTRAVENOUS | Status: AC
  Filled 2014-07-16: qty 20

## 2014-07-16 MED ORDER — MIDAZOLAM HCL 2 MG/2ML IJ SOLN: 1.0000 mg | INTRAMUSCULAR | Status: DC | PRN

## 2014-07-16 MED ORDER — CEFAZOLIN SODIUM-DEXTROSE 2-3 GM-% IV SOLR
2.0000 g | INTRAVENOUS | Status: AC
  Administered 2014-07-16: 2 g via INTRAVENOUS

## 2014-07-16 MED ORDER — HYDROMORPHONE HCL 1 MG/ML IJ SOLN
0.2500 mg | INTRAMUSCULAR | Status: DC | PRN
  Administered 2014-07-16 – 2014-07-17 (×4): 0.5 mg via INTRAVENOUS

## 2014-07-16 MED ORDER — DIPHENHYDRAMINE HCL 50 MG/ML IJ SOLN
INTRAMUSCULAR | Status: DC | PRN
  Administered 2014-07-16: 6 mg via INTRAVENOUS

## 2014-07-16 MED ORDER — HYDROMORPHONE HCL 1 MG/ML IJ SOLN
INTRAMUSCULAR | Status: AC
  Filled 2014-07-16: qty 1

## 2014-07-16 MED ORDER — FENTANYL CITRATE 0.05 MG/ML IJ SOLN
INTRAMUSCULAR | Status: DC | PRN
  Administered 2014-07-16 (×2): 50 ug via INTRAVENOUS
  Administered 2014-07-16: 100 ug via INTRAVENOUS

## 2014-07-16 MED ORDER — OXYCODONE HCL 5 MG PO TABS
ORAL_TABLET | ORAL | Status: AC
  Filled 2014-07-16: qty 1

## 2014-07-16 MED ORDER — SODIUM CHLORIDE 0.9 % IV SOLN: INTRAVENOUS | Status: DC | PRN

## 2014-07-16 MED ORDER — LACTATED RINGERS IV SOLN
INTRAVENOUS | Status: DC
  Administered 2014-07-16 (×3): via INTRAVENOUS

## 2014-07-16 MED ORDER — MIDAZOLAM HCL 5 MG/5ML IJ SOLN
INTRAMUSCULAR | Status: DC | PRN
  Administered 2014-07-16: 2 mg via INTRAVENOUS

## 2014-07-16 MED ORDER — CHLORHEXIDINE GLUCONATE 4 % EX LIQD: 1.0000 "application " | Freq: Once | CUTANEOUS | Status: DC

## 2014-07-16 MED ORDER — PROPOFOL 10 MG/ML IV BOLUS
INTRAVENOUS | Status: DC | PRN
  Administered 2014-07-16: 150 mg via INTRAVENOUS
  Administered 2014-07-16: 30 mg via INTRAVENOUS

## 2014-07-16 MED ORDER — MIDAZOLAM HCL 5 MG/5ML IJ SOLN: INTRAMUSCULAR | Status: DC | PRN

## 2014-07-16 MED ORDER — DEXAMETHASONE SODIUM PHOSPHATE 4 MG/ML IJ SOLN
INTRAMUSCULAR | Status: DC | PRN
  Administered 2014-07-16: 10 mg via INTRAVENOUS

## 2014-07-16 MED ORDER — OXYCODONE HCL 5 MG PO TABS
5.0000 mg | ORAL_TABLET | Freq: Once | ORAL | Status: AC
  Administered 2014-07-16: 5 mg via ORAL

## 2014-07-16 MED ORDER — CEFAZOLIN SODIUM-DEXTROSE 2-3 GM-% IV SOLR
INTRAVENOUS | Status: AC
  Filled 2014-07-16: qty 50

## 2014-07-16 MED ORDER — ONDANSETRON HCL 4 MG/2ML IJ SOLN
INTRAMUSCULAR | Status: DC | PRN
Start: 2014-07-16 — End: 2014-07-16
  Administered 2014-07-16: 4 mg via INTRAVENOUS

## 2014-07-16 MED ORDER — BUPIVACAINE-EPINEPHRINE (PF) 0.25% -1:200000 IJ SOLN
INTRAMUSCULAR | Status: AC
  Filled 2014-07-16: qty 30

## 2014-07-16 MED ORDER — MIDAZOLAM HCL 2 MG/2ML IJ SOLN
INTRAMUSCULAR | Status: AC
  Filled 2014-07-16: qty 2

## 2014-07-16 MED ORDER — FENTANYL CITRATE 0.05 MG/ML IJ SOLN
INTRAMUSCULAR | Status: AC
  Filled 2014-07-16: qty 6

## 2014-07-16 MED ORDER — LIDOCAINE HCL (CARDIAC) 20 MG/ML IV SOLN
INTRAVENOUS | Status: DC | PRN
  Administered 2014-07-16: 40 mg via INTRAVENOUS

## 2014-07-16 MED ORDER — BUPIVACAINE-EPINEPHRINE 0.25% -1:200000 IJ SOLN
INTRAMUSCULAR | Status: DC | PRN
  Administered 2014-07-16: 4 mL

## 2014-07-16 SURGICAL SUPPLY — 48 items
APPLIER CLIP ROT 10 11.4 M/L (STAPLE) ×3
APR CLP MED LRG 11.4X10 (STAPLE) ×1
BAG SPEC RTRVL LRG 6X4 10 (ENDOMECHANICALS) ×1
BLADE CLIPPER SURG (BLADE) IMPLANT
CANISTER SUCT 1200ML W/VALVE (MISCELLANEOUS) ×5 IMPLANT
CHLORAPREP W/TINT 26ML (MISCELLANEOUS) ×3 IMPLANT
CLIP APPLIE ROT 10 11.4 M/L (STAPLE) ×1 IMPLANT
CLOSURE WOUND 1/2 X4 (GAUZE/BANDAGES/DRESSINGS)
COVER MAYO STAND STRL (DRAPES) ×3 IMPLANT
DECANTER SPIKE VIAL GLASS SM (MISCELLANEOUS) ×2 IMPLANT
DRAPE C-ARM 42X72 X-RAY (DRAPES) ×3 IMPLANT
ELECT REM PT RETURN 9FT ADLT (ELECTROSURGICAL) ×3
ELECTRODE REM PT RTRN 9FT ADLT (ELECTROSURGICAL) ×1 IMPLANT
FILTER SMOKE EVAC LAPAROSHD (FILTER) IMPLANT
GLOVE BIO SURGEON STRL SZ7 (GLOVE) ×2 IMPLANT
GLOVE BIO SURGEON STRL SZ8 (GLOVE) ×3 IMPLANT
GLOVE BIOGEL PI IND STRL 6.5 (GLOVE) IMPLANT
GLOVE BIOGEL PI IND STRL 7.5 (GLOVE) IMPLANT
GLOVE BIOGEL PI IND STRL 8 (GLOVE) ×1 IMPLANT
GLOVE BIOGEL PI INDICATOR 6.5 (GLOVE) ×2
GLOVE BIOGEL PI INDICATOR 7.5 (GLOVE) ×2
GLOVE BIOGEL PI INDICATOR 8 (GLOVE) ×2
GLOVE ECLIPSE 6.5 STRL STRAW (GLOVE) ×4 IMPLANT
GOWN STRL REUS W/ TWL LRG LVL3 (GOWN DISPOSABLE) ×3 IMPLANT
GOWN STRL REUS W/TWL LRG LVL3 (GOWN DISPOSABLE) ×9
HEMOSTAT SNOW SURGICEL 2X4 (HEMOSTASIS) ×1 IMPLANT
IV NS IRRIG 3000ML ARTHROMATIC (IV SOLUTION) ×4 IMPLANT
LINER CANISTER 1000CC FLEX (MISCELLANEOUS) ×1 IMPLANT
LIQUID BAND (GAUZE/BANDAGES/DRESSINGS) ×3 IMPLANT
NS IRRIG 1000ML POUR BTL (IV SOLUTION) IMPLANT
PACK BASIN DAY SURGERY FS (CUSTOM PROCEDURE TRAY) ×3 IMPLANT
POUCH SPECIMEN RETRIEVAL 10MM (ENDOMECHANICALS) ×3 IMPLANT
SCISSORS LAP 5X35 DISP (ENDOMECHANICALS) ×2 IMPLANT
SET CHOLANGIOGRAPH 5 50 .035 (SET/KITS/TRAYS/PACK) ×3 IMPLANT
SET IRRIG TUBING LAPAROSCOPIC (IRRIGATION / IRRIGATOR) ×3 IMPLANT
SLEEVE ENDOPATH XCEL 5M (ENDOMECHANICALS) ×3 IMPLANT
SLEEVE SCD COMPRESS KNEE MED (MISCELLANEOUS) ×3 IMPLANT
STRIP CLOSURE SKIN 1/2X4 (GAUZE/BANDAGES/DRESSINGS) IMPLANT
SUT MNCRL AB 4-0 PS2 18 (SUTURE) ×3 IMPLANT
SUT VICRYL 0 UR6 27IN ABS (SUTURE) IMPLANT
TOWEL OR 17X24 6PK STRL BLUE (TOWEL DISPOSABLE) ×3 IMPLANT
TRAY LAPAROSCOPIC (CUSTOM PROCEDURE TRAY) ×3 IMPLANT
TROCAR XCEL BLUNT TIP 100MML (ENDOMECHANICALS) ×3 IMPLANT
TROCAR XCEL NON-BLD 11X100MML (ENDOMECHANICALS) ×3 IMPLANT
TROCAR XCEL NON-BLD 5MMX100MML (ENDOMECHANICALS) ×3 IMPLANT
TUBE CONNECTING 20'X1/4 (TUBING)
TUBE CONNECTING 20X1/4 (TUBING) ×1 IMPLANT
TUBING INSUFFLATION (TUBING) ×3 IMPLANT

## 2014-07-16 NOTE — Op Note (Signed)
Laparoscopic Cholecystectomy Procedure Note  Indications: This patient presents with symptomatic gallbladder disease and will undergo laparoscopic cholecystectomy.The procedure has been discussed with the patient. Operative and non operative treatments have been discussed. Risks of surgery include bleeding, infection,  Common bile duct injury,  Injury to the stomach,liver, colon,small intestine, abdominal wall,  Diaphragm,  Major blood vessels,  And the need for an open procedure.  Other risks include worsening of medical problems, death,  DVT and pulmonary embolism, and cardiovascular events.   Medical options have also been discussed. The patient has been informed of long term expectations of surgery and non surgical options,  The patient agrees to proceed.    Pre-operative Diagnosis: Calculus of gallbladder without mention of cholecystitis or obstruction  Post-operative Diagnosis: Same  Surgeon: Rashidah [place] A.   Assistants: none  Anesthesia: General endotracheal anesthesia and Local anesthesia 0.25.% bupivacaine, with epinephrine  ASA Class: 1  Procedure Details  The patient was seen again in the Holding Room. The risks, benefits, complications, treatment options, and expected outcomes were discussed with the patient. The possibilities of reaction to medication, pulmonary aspiration, perforation of viscus, bleeding, recurrent infection, finding a normal gallbladder, the need for additional procedures, failure to diagnose a condition, the possible need to convert to an open procedure, and creating a complication requiring transfusion or operation were discussed with the patient. The patient and/or family concurred with the proposed plan, giving informed consent. The site of surgery properly noted/marked. The patient was taken to Operating Room, identified as Bobbye Riggselly E Zetino de Romero and the procedure verified as Laparoscopic Cholecystectomy with Intraoperative Cholangiograms. A Time Out was  held and the above information confirmed.  Prior to the induction of general anesthesia, antibiotic prophylaxis was administered. General endotracheal anesthesia was then administered and tolerated well. After the induction, the abdomen was prepped in the usual sterile fashion. The patient was positioned in the supine position with the left arm comfortably tucked, along with some reverse Trendelenburg.  Local anesthetic agent was injected into the skin near the umbilicus and an incision made. The midline fascia was incised and the Hasson technique was used to introduce a 12 mm port under direct vision. It was secured with a figure of eight Vicryl suture placed in the usual fashion. Pneumoperitoneum was then created with CO2 and tolerated well without any adverse changes in the patient's vital signs. Additional trocars were introduced under direct vision with an 11 mm trocar in the epigastrium and 2 5 mm trocars in the right upper quadrant. All skin incisions were infiltrated with a local anesthetic agent before making the incision and placing the trocars.   The gallbladder was identified, the fundus grasped and retracted cephalad. Adhesions were lysed bluntly and with the electrocautery where indicated, taking care not to injure any adjacent organs or viscus. The infundibulum was grasped and retracted laterally, exposing the peritoneum overlying the triangle of Calot. This was then divided and exposed in a blunt fashion. The cystic duct was clearly identified and bluntly dissected circumferentially. The junctions of the gallbladder, cystic duct and common bile duct were clearly identified prior to the division of any linear structure.   Given her clear anatomy and critical view along with normal LFTs and non dilated CBD,  I elected to forgo  Cholangiogram.    The cystic duct was then  ligated with surgical clips  on the patient side and  clipped on the gallbladder side and divided. The cystic artery was  identified, dissected free, ligated with clips  and divided as well. Posterior cystic artery clipped and divided.  The gallbladder was dissected from the liver bed in retrograde fashion with the electrocautery. The gallbladder was removed. There was spillage of bile and stones but these were quickly suctioned up and copious amounts of irrigation was used until clear.  The liver bed was irrigated and inspected. Hemostasis was achieved with the electrocautery. Copious irrigation was utilized and was repeatedly aspirated until clear all particulate matter. Hemostasis was achieved with no signs  Of bleeding or bile leakage.  Pneumoperitoneum was completely reduced after viewing removal of the trocars under direct vision. The wound was thoroughly irrigated and the fascia was then closed with a figure of eight suture; the skin was then closed with 4 O monocryl  and a sterile dressing was applied.  Instrument, sponge, and needle counts were correct at closure and at the conclusion of the case.   Findings: Cholelithiasis  Estimated Blood Loss: Minimal         Drains: none           Total IV Fluids: 1000 mL         Specimens: Gallbladder           Complications: None; patient tolerated the procedure well.         Disposition: PACU - hemodynamically stable.         Condition: stable

## 2014-07-16 NOTE — H&P (View-Only) (Signed)
elly Coultas 06/23/2014 2:06 PM Location: [facility] Patient #: [id number] DOB: 12/30/1975 Married / Language: English / Race: Undefined Female History of Present Illness ([name] A. Kassie Keng MD; 06/23/2014 2:25 PM) Patient words: eval gallbladder  PT SENT DUE TO SYMPTOMATIC CHOLELITHIASIS AND EPIGASTR2IC ABDOMINAL PAIN AFTER EATING. mADE WORSE WITH FATS. SHARP AND LOCATION EPIGASTRIUM. U/S SHOWS GALLSTONES NO THICKENING AND NL CBD.  The patient is a 38 year old female who presents for evaluation of gall stones. The onset of the gall stones has been gradual and has been occurring in an intermittent pattern for 12 months. The course has been gradually worsening. The gall stones is described as moderate. There has been associated abdominal pain, back pain, burning pain and colicky pain. Precipitating factors include excessive fat intake. There are no relieving factors. Past Surgical History (Michelle R Brooks, CMA; 06/23/2014 2:08 PM) None 06/23/2014  Diagnostic Studies History (Michelle R Brooks, CMA; 06/23/2014 2:07 PM) Colonoscopy never Pap Smear never  Allergies (Michelle R Brooks, CMA; 06/23/2014 2:07 PM) No Known Drug Allergies 06/23/2014  Medication History (Michelle R Brooks, CMA; 06/23/2014 2:08 PM) Ortho Micronor (0.35MG Tablet, Oral) Active. Prenatal Multivit-Min-FA (1MG Capsule, Oral) Active.  Pregnancy / Birth History (Michelle R Brooks, CMA; 06/23/2014 2:07 PM) Age at menarche 13 years. Gravida 5 Maternal age 21-25 Para 4 Regular periods     Review of Systems (Michelle R. Brooks CMA; 06/23/2014 2:07 PM) Gastrointestinal Present- Excessive gas. Not Present- Abdominal Pain, Bloating, Bloody Stool, Change in Bowel Habits, Chronic diarrhea, Constipation, Difficulty Swallowing, Gets full quickly at meals, Hemorrhoids, Indigestion, Nausea, Rectal Pain and Vomiting.  Vitals (Michelle R. Brooks CMA; 06/23/2014 2:07 PM) 06/23/2014 2:06  PM Weight: 151.5 lb Height: 63in Body Surface Area: 1.75 m Body Mass Index: 26.84 kg/m BP: 122/80 (Sitting, Left Arm, Standard)     Physical Exam (Yolanda Huffstetler A. Rudell Ortman MD; 06/23/2014 2:21 PM)  General Mental Status-Alert. General Appearance-Consistent with stated age. Hydration-Well hydrated. Voice-Normal.  Head and Neck Head-normocephalic, atraumatic with no lesions or palpable masses.  Eye Eyeball - Bilateral-Extraocular movements intact. Sclera/Conjunctiva - Bilateral-No scleral icterus.  Chest and Lung Exam Chest and lung exam reveals -quiet, even and easy respiratory effort with no use of accessory muscles and on auscultation, normal breath sounds, no adventitious sounds and normal vocal resonance. Inspection Chest Wall - Normal. Back - normal.  Cardiovascular Cardiovascular examination reveals -on palpation PMI is normal in location and amplitude, no palpable S3 or S4. Normal cardiac borders., normal heart sounds, regular rate and rhythm with no murmurs, carotid auscultation reveals no bruits and normal pedal pulses bilaterally.  Abdomen Inspection Inspection of the abdomen reveals - No Hernias. Skin - Scar - no surgical scars. Palpation/Percussion Palpation and Percussion of the abdomen reveal - Soft, Non Tender, No Rebound tenderness, No Rigidity (guarding) and No hepatosplenomegaly. Auscultation Auscultation of the abdomen reveals - Bowel sounds normal.  Neurologic Neurologic evaluation reveals -alert and oriented x 3 with no impairment of recent or remote memory. Mental Status-Normal.  Musculoskeletal Normal Exam - Left-Upper Extremity Strength Normal and Lower Extremity Strength Normal. Normal Exam - Right-Upper Extremity Strength Normal, Lower Extremity Weakness.    Assessment & Plan (Lashundra Shiveley A. Jazminn Pomales MD; 06/23/2014 2:22 PM)  SYMPTOMATIC CHOLELITHIASIS (574.20  K80.20) Impression: DISCUSSED MEDICAL AND SURGICAL OPTIONS  OF TREATMENT. RECOMMEND LAPAROSCOPIC CHOLECYSTECTOMY AND CHOLANGIOGRAM. The procedure has been discussed with the patient. Risks of laparoscopic cholecystectomy include bleeding, infection, bile duct injury, leak, death, open surgery, diarrhea, other surgery, organ injury, blood vessel injury, DVT,   and additional care.  Current Plans Pt Education - [facility] HCI Pt Education - Cholecystectomy: Gallbladder Removal with Open Surgery: laparoscopic cholecystectomy

## 2014-07-16 NOTE — Interval H&P Note (Signed)
History and Physical Interval Note:  07/16/2014 12:01 PM  [name]  has presented today for surgery, with the diagnosis of Gallstones  The various methods of treatment have been discussed with the patient and family. After consideration of risks, benefits and other options for treatment, the patient has consented to  Procedure(s): LAPAROSCOPIC CHOLECYSTECTOMY WITH INTRAOPERATIVE CHOLANGIOGRAM (N/A) as a surgical intervention .  The patient's history has been reviewed, patient examined, no change in status, stable for surgery.  I have reviewed the patient's chart and labs.  Questions were answered to the patient's satisfaction.     [name] A.

## 2014-07-16 NOTE — Discharge Instructions (Signed)
Colecistectoma laparoscpica - Cuidados posteriores (Laparoscopic Cholecystectomy, Care After) Siga estas instrucciones durante las prximas semanas. Estas indicaciones le proporcionan informacin general acerca de cmo deber cuidarse despus del procedimiento. El mdico tambin podr darle instrucciones ms especficas. El tratamiento ha sido planificado segn las prcticas mdicas actuales, pero en algunos casos pueden ocurrir problemas. Comunquese con el mdico si tiene algn problema o tiene dudas despus del procedimiento. QU ESPERAR DESPUS DEL PROCEDIMIENTO Despus del procedimiento, es comn tener las siguientes sensaciones:  Dolor en los lugares de la incisin. Le darn analgsicos para Human resources officercontrolar el dolor.  Nuseas o vmitos leves. Estos sntomas deberan mejorar despus de las primeras 24horas.  Meteorismo y Designer, fashion/clothingposiblemente dolor en el hombro debido al gas que se [place] durante el procedimiento. Estos sntomas mejorarn despus de las primeras 24horas. INSTRUCCIONES PARA EL CUIDADO EN EL HOGAR   Cambie los apsitos (vendajes) tal como le indic el mdico.  Mantenga la herida limpia y seca. Puede lavar la herida suavemente con agua y [place]. Seque dando palmaditas suaves.  No se bae en la baera, no practique natacin ni use el jacuzzy durante 2semanas o hasta que lo autorice el mdico.  Dilkonome solo medicamentos de venta libre o recetados, segn las indicaciones del mdico.  Siga su dieta normal segn las indicaciones de su mdico.  No levante ningn objeto que pese ms de 10libras (4,5kg) hasta que el mdico lo autorice.  No practique deportes de contacto durante 1semana o hasta que el mdico lo autorice. SOLICITE ATENCIN MDICA SI:   Presenta enrojecimiento, hinchazn o aumento del dolor en la herida.  Observa una secrecin de color blanco amarillento (pus) en la herida.  Hay una secrecin en la herida que dura ms de 1da.  Advierte un olor ftido que proviene de la  herida o del vendaje.  Los cortes quirrgicos (incisiones) se abren. SOLICITE ATENCIN MDICA DE INMEDIATO SI:   Le aparece una erupcin cutnea.  Tiene dificultad para respirar.  Siente dolor en el pecho.  Tiene fiebre.  Nota un incremento del dolor en los hombros (en la zona donde van los breteles).  Presenta episodios de mareos o se siente dbil cuando est de pie.  Siente un dolor abdominal intenso.  Tiene Programme researcher, broadcasting/film/videomalestar estomacal (nuseas) o vomita y esto dura ms de 1da. Document Released: 02/28/2011 Document Revised: 05/08/2013 [facility] Patient Information 2015 [place]Care, [place]. This information is not intended to replace advice given to you by your health care provider. Make sure you discuss any questions you have with your health care provider.    Post Anesthesia Home Care Instructions  Activity: Get plenty of rest for the remainder of the day. A responsible adult should stay with you for 24 hours following the procedure.  For the next 24 hours, DO NOT: -Drive a car -Advertising copywriterperate machinery -Drink alcoholic beverages -Take any medication unless instructed by your physician -Make any legal decisions or sign important papers.  Meals: Start with liquid foods such as gelatin or soup. Progress to regular foods as tolerated. Avoid greasy, spicy, heavy foods. If nausea and/or vomiting occur, drink only clear liquids until the nausea and/or vomiting subsides. Call your physician if vomiting continues.  Special Instructions/Symptoms: Your throat may feel dry or sore from the anesthesia or the breathing tube placed in your throat during surgery. If this causes discomfort, gargle with warm salt water. The discomfort should disappear within 24 hours.

## 2014-07-16 NOTE — Transfer of Care (Signed)
Immediate Anesthesia Transfer of Care Note  Patient: [name]  Procedure(s) Performed: Procedure(s): LAPAROSCOPIC CHOLECYSTECTOMY WITH INTRAOPERATIVE CHOLANGIOGRAM (N/A)  Patient Location: PACU  Anesthesia Type:General  Level of Consciousness: awake, sedated and patient cooperative  Airway & Oxygen Therapy: Patient Spontanous Breathing and Patient connected to face mask oxygen  Post-op Assessment: Report given to PACU RN, Post -op Vital signs reviewed and stable and Patient moving all extremities  Post vital signs: Reviewed and stable  Complications: No apparent anesthesia complications

## 2014-07-16 NOTE — Anesthesia Postprocedure Evaluation (Signed)
  Anesthesia Post-op Note  Patient: [name]  Procedure(s) Performed: Procedure(s): LAPAROSCOPIC CHOLECYSTECTOMY WITH INTRAOPERATIVE CHOLANGIOGRAM (N/A)  Patient Location: PACU  Anesthesia Type:General  Level of Consciousness: awake and alert   Airway and Oxygen Therapy: Patient Spontanous Breathing  Post-op Pain: mild  Post-op Assessment: Post-op Vital signs reviewed, Patient's Cardiovascular Status Stable and Respiratory Function Stable  Post-op Vital Signs: Reviewed  Filed Vitals:   07/16/14 1500  BP: 127/81  Pulse: 79  Temp:   Resp: 13    Complications: No apparent anesthesia complications

## 2014-07-16 NOTE — Progress Notes (Signed)
Interpreter Graciela Namihira for pre surgery   °

## 2014-07-16 NOTE — Anesthesia Procedure Notes (Signed)
Procedure Name: Intubation Date/Time: 07/16/2014 12:18 PM Performed by: Curly ShoresRAFT, Nasira Janusz W Pre-anesthesia Checklist: Patient identified, Emergency Drugs available, Suction available and Patient being monitored Patient Re-evaluated:Patient Re-evaluated prior to inductionOxygen Delivery Method: Circle System Utilized Preoxygenation: Pre-oxygenation with 100% oxygen Intubation Type: IV induction Ventilation: Mask ventilation without difficulty Laryngoscope Size: Miller and 2 Grade View: Grade I Tube type: Oral Tube size: 7.0 mm Number of attempts: 1 Airway Equipment and Method: stylet and oral airway Placement Confirmation: ETT inserted through vocal cords under direct vision,  positive ETCO2 and breath sounds checked- equal and bilateral Secured at: 21 cm Tube secured with: Tape Dental Injury: Teeth and Oropharynx as per pre-operative assessment

## 2014-07-16 NOTE — Anesthesia Preprocedure Evaluation (Addendum)
Anesthesia Evaluation  Patient identified by MRN, date of birth, ID band Patient awake    Reviewed: Allergy & Precautions, H&P , NPO status , Patient's Chart, lab work & pertinent test results  Airway Mallampati: II  TM Distance: >3 FB Neck ROM: Full    Dental no notable dental hx. (+) Teeth Intact, Dental Advisory Given   Pulmonary neg pulmonary ROS,  breath sounds clear to auscultation  Pulmonary exam normal       Cardiovascular negative cardio ROS  Rhythm:Regular Rate:Normal  PIH. Not currently hypertensive.   Neuro/Psych negative neurological ROS  negative psych ROS   GI/Hepatic negative GI ROS, Neg liver ROS, GERD-  Controlled,  Endo/Other  negative endocrine ROS  Renal/GU negative Renal ROS  negative genitourinary   Musculoskeletal   Abdominal   Peds  Hematology negative hematology ROS (+)   Anesthesia Other Findings   Reproductive/Obstetrics negative OB ROS                            Anesthesia Physical Anesthesia Plan  ASA: I  Anesthesia Plan: General   Post-op Pain Management:    Induction: Intravenous  Airway Management Planned: Oral ETT  Additional Equipment:   Intra-op Plan:   Post-operative Plan: Extubation in OR  Informed Consent: I have reviewed the patients History and Physical, chart, labs and discussed the procedure including the risks, benefits and alternatives for the proposed anesthesia with the patient or authorized representative who has indicated his/her understanding and acceptance.   Dental advisory given  Plan Discussed with: CRNA  Anesthesia Plan Comments: (Pt is breast feeding. Advised to pump and dump for 24 hrs.)       Anesthesia Quick Evaluation

## 2014-07-17 ENCOUNTER — Encounter (HOSPITAL_BASED_OUTPATIENT_CLINIC_OR_DEPARTMENT_OTHER): Payer: Self-pay | Admitting: Surgery

## 2014-09-23 ENCOUNTER — Ambulatory Visit (INDEPENDENT_AMBULATORY_CARE_PROVIDER_SITE_OTHER): Payer: Medicaid Other | Admitting: Obstetrics

## 2014-09-23 ENCOUNTER — Encounter: Payer: Self-pay | Admitting: Obstetrics

## 2014-09-23 VITALS — BP 127/91 | HR 87 | Temp 97.1°F | Ht 62.0 in | Wt 154.0 lb

## 2014-09-23 DIAGNOSIS — Z30013 Encounter for initial prescription of injectable contraceptive: Secondary | ICD-10-CM

## 2014-09-23 DIAGNOSIS — Z3009 Encounter for other general counseling and advice on contraception: Secondary | ICD-10-CM

## 2014-09-23 DIAGNOSIS — I10 Essential (primary) hypertension: Secondary | ICD-10-CM

## 2014-09-23 DIAGNOSIS — Z0000 Encounter for general adult medical examination without abnormal findings: Secondary | ICD-10-CM

## 2014-09-23 MED ORDER — TRIAMTERENE-HCTZ 37.5-25 MG PO CAPS: 1.0000 | ORAL_CAPSULE | Freq: Every day | ORAL | Status: DC

## 2014-09-23 MED ORDER — MEDROXYPROGESTERONE ACETATE 150 MG/ML IM SUSP: 150.0000 mg | INTRAMUSCULAR | Status: DC

## 2014-09-23 MED ORDER — PRENATAL PLUS 27-1 MG PO TABS: 30.0000 | ORAL_TABLET | Freq: Every day | ORAL | Status: DC

## 2014-09-23 NOTE — Progress Notes (Signed)
Subjective:    Sierra SquiresNelly E Zetino de Lonna Murphy is a 39 y.o. female who presents for contraception counseling. The patient has no complaints today. The patient is sexually active. Pertinent past medical history: none.  The information documented in the HPI was reviewed and verified.  Menstrual History: OB History    Gravida Para Term Preterm AB TAB SAB Ectopic Multiple Living   6 4 4  2  2   4        Patient's last menstrual period was 09/03/2014.   Patient Active Problem List   Diagnosis Date Noted  . Unspecified high-risk pregnancy 05/13/2013  . GERD without esophagitis 04/08/2013   Past Medical History  Diagnosis Date  . PIH (pregnancy induced hypertension)   . Wears glasses   . Gall stones     Past Surgical History  Procedure Laterality Date  . No past surgeries    . Cholecystectomy N/A 07/16/2014    Procedure: LAPAROSCOPIC CHOLECYSTECTOMY WITH INTRAOPERATIVE CHOLANGIOGRAM;  Surgeon: Harriette Bouillonhomas Cornett, MD;  Location: [facility];  Service: General;  Laterality: N/A;     Current outpatient prescriptions:  .  norethindrone (ORTHO MICRONOR) 0.35 MG tablet, Take 1 tablet (0.35 mg total) by mouth daily., Disp: 28 tablet, Rfl: 11 .  prenatal vitamin w/FE, FA (PRENATAL 1 + 1) 27-1 MG TABS tablet, Take 30 tablets by mouth daily at 12 noon., Disp: 30 each, Rfl: 11 .  medroxyPROGESTERone (DEPO-PROVERA) 150 MG/ML injection, Inject 1 mL (150 mg total) into the muscle every 3 (three) months., Disp: 1 mL, Rfl: 3 No Known Allergies  History  Substance Use Topics  . Smoking status: Never Smoker   . Smokeless tobacco: Never Used  . Alcohol Use: No    Family History  Problem Relation Age of Onset  . Hypertension Mother   . Diabetes Father        Review of Systems Constitutional: negative for weight loss Genitourinary:negative for abnormal menstrual periods and vaginal discharge   Objective:   BP 127/91 mmHg  Pulse 87  Temp(Src) 97.1 F (36.2 C)  Ht 5\' 2"  (1.575 m)   Wt 154 lb (69.854 kg)  BMI 28.16 kg/m2  LMP 09/03/2014  Breastfeeding? No   PE:  Deferred  Lab Review Urine pregnancy test Labs reviewed yes Radiologic studies reviewed no  100% of 10 min visit spent on counseling and coordination of care.   Assessment:    39 y.o., starting Depo-Provera injections, no contraindications.   Hypertension, mild.  Plan:   Depo Provera Rx Dyazide Rx   All questions answered. Contraception: Depo-Provera injections. Discussed healthy lifestyle modifications. Follow up in several months.  Meds ordered this encounter  Medications  . prenatal vitamin w/FE, FA (PRENATAL 1 + 1) 27-1 MG TABS tablet    Sig: Take 30 tablets by mouth daily at 12 noon.    Dispense:  30 each    Refill:  11  . medroxyPROGESTERone (DEPO-PROVERA) 150 MG/ML injection    Sig: Inject 1 mL (150 mg total) into the muscle every 3 (three) months.    Dispense:  1 mL    Refill:  3   No orders of the defined types were placed in this encounter.

## 2014-09-24 ENCOUNTER — Other Ambulatory Visit (INDEPENDENT_AMBULATORY_CARE_PROVIDER_SITE_OTHER): Payer: Medicaid Other

## 2014-09-24 ENCOUNTER — Other Ambulatory Visit: Payer: Self-pay | Admitting: Obstetrics

## 2014-09-24 DIAGNOSIS — Z30013 Encounter for initial prescription of injectable contraceptive: Secondary | ICD-10-CM

## 2014-09-24 DIAGNOSIS — Z789 Other specified health status: Secondary | ICD-10-CM

## 2014-09-24 DIAGNOSIS — Z0000 Encounter for general adult medical examination without abnormal findings: Secondary | ICD-10-CM

## 2014-09-24 LAB — COMPREHENSIVE METABOLIC PANEL
ALK PHOS: 84 U/L (ref 39–117)
ALT: 22 U/L (ref 0–35)
AST: 21 U/L (ref 0–37)
Albumin: 4.6 g/dL (ref 3.5–5.2)
BILIRUBIN TOTAL: 0.7 mg/dL (ref 0.2–1.2)
BUN: 11 mg/dL (ref 6–23)
CO2: 23 meq/L (ref 19–32)
Calcium: 9.3 mg/dL (ref 8.4–10.5)
Chloride: 102 mEq/L (ref 96–112)
Creat: 0.68 mg/dL (ref 0.50–1.10)
Glucose, Bld: 87 mg/dL (ref 70–99)
Potassium: 4.2 mEq/L (ref 3.5–5.3)
SODIUM: 136 meq/L (ref 135–145)
Total Protein: 7.6 g/dL (ref 6.0–8.3)

## 2014-09-24 LAB — LIPID PANEL
Cholesterol: 155 mg/dL (ref 0–200)
HDL: 46 mg/dL (ref >=46)
LDL Cholesterol: 77 mg/dL (ref 0–99)
TRIGLYCERIDES: 161 mg/dL — AB (ref <150)
Total CHOL/HDL Ratio: 3.4 Ratio
VLDL: 32 mg/dL (ref 0–40)

## 2014-09-24 LAB — CBC
HEMATOCRIT: 41.2 % (ref 36.0–46.0)
Hemoglobin: 13.8 g/dL (ref 12.0–15.0)
MCH: 25.7 pg — AB (ref 26.0–34.0)
MCHC: 33.5 g/dL (ref 30.0–36.0)
MCV: 76.9 fL — ABNORMAL LOW (ref 78.0–100.0)
MPV: 9.3 fL (ref 8.6–12.4)
Platelets: 330 10*3/uL (ref 150–400)
RBC: 5.36 MIL/uL — ABNORMAL HIGH (ref 3.87–5.11)
RDW: 14.1 % (ref 11.5–15.5)
WBC: 9.1 10*3/uL (ref 4.0–10.5)

## 2014-09-24 LAB — TSH: TSH: 1.655 u[IU]/mL (ref 0.350–4.500)

## 2014-09-24 MED ORDER — MEDROXYPROGESTERONE ACETATE 150 MG/ML IM SUSP
150.0000 mg | INTRAMUSCULAR | Status: AC
  Administered 2014-09-24: 150 mg via INTRAMUSCULAR

## 2014-09-24 NOTE — Progress Notes (Signed)
Patient also in the office today for her DEPO Injection. This is the patients first Injection. Ok to give per Dr. Clearance CootsHarper. Injection given in Right Upper Outer Quadrant. Patient tolerated well. Patient notified to make an appointment with the front for Dec 17, 2014 for her next DEPO Injection. Patient voiced understanding.   Administrations This Visit    medroxyPROGESTERone (DEPO-PROVERA) injection 150 mg    Admin Date Action Dose Route Administered By         09/24/2014 Given 150 mg Intramuscular Odessa FlemingKristina M Orie Cuttino, LPN

## 2014-09-25 LAB — VITAMIN D 25 HYDROXY (VIT D DEFICIENCY, FRACTURES): Vit D, 25-Hydroxy: 13 ng/mL — ABNORMAL LOW (ref 30–100)

## 2014-12-17 ENCOUNTER — Ambulatory Visit: Payer: Medicaid Other

## 2015-06-09 ENCOUNTER — Ambulatory Visit: Payer: Medicaid Other | Admitting: Obstetrics

## 2015-11-12 ENCOUNTER — Ambulatory Visit (INDEPENDENT_AMBULATORY_CARE_PROVIDER_SITE_OTHER): Payer: Self-pay | Admitting: Internal Medicine

## 2015-11-12 ENCOUNTER — Encounter: Payer: Self-pay | Admitting: Internal Medicine

## 2015-11-12 VITALS — BP 130/90 | HR 79 | Resp 18 | Ht 62.0 in | Wt 152.5 lb

## 2015-11-12 DIAGNOSIS — H11009 Unspecified pterygium of unspecified eye: Secondary | ICD-10-CM | POA: Insufficient documentation

## 2015-11-12 DIAGNOSIS — H11001 Unspecified pterygium of right eye: Secondary | ICD-10-CM

## 2015-11-12 DIAGNOSIS — R030 Elevated blood-pressure reading, without diagnosis of hypertension: Secondary | ICD-10-CM

## 2015-11-12 DIAGNOSIS — IMO0001 Reserved for inherently not codable concepts without codable children: Secondary | ICD-10-CM

## 2015-11-12 NOTE — Progress Notes (Signed)
    Subjective:    Patient ID: [name], female    DOB: [birth year], 40 y.o.   MRN: [id number]  HPI  1.  Right red eye:  Has had red eye for 1 year. Often burns.  No itching.  No watering.  Vision seems to be okay.  Redness and burning limited to a small spot about 9 O'clock in right eye next to iris.  2.  Would like to get set up for a CPE.  Last pap was 06/2014.  Married for 18 years.  Monogamous with husband.   Has never had a mammogram baseline  3.  Elevated BP:  Patient only has history of PIH.  Has never had elevated BP before when not pregnant.   Only with her last pregnancy 2 years ago.  Took Dyazide at one time.  No current outpatient prescriptions on file.   No Known Allergies   Past Medical History  Diagnosis Date  . Wears glasses   . Gall stones   . PIH (pregnancy induced hypertension)    Past Surgical History  Procedure Laterality Date  . No past surgeries    . Cholecystectomy N/A 07/16/2014    Procedure: LAPAROSCOPIC CHOLECYSTECTOMY WITH INTRAOPERATIVE CHOLANGIOGRAM;  Surgeon: Harriette Bouillonhomas Cornett, MD;  Location: [facility];  Service: General;  Laterality: N/A;   Family History  Problem Relation Age of Onset  . Hypertension Mother   . Arthritis Mother   . Hypertension Father   . Hypertension Sister    Social History   Social History  . Marital Status: Married    Spouse Name: Royston BakeYony  . Number of Children: 4  . Years of Education: 16   Occupational History  . Housewife    Social History Main Topics  . Smoking status: Never Smoker   . Smokeless tobacco: Never Used  . Alcohol Use: No  . Drug Use: No  . Sexual Activity:    Partners: Male    Birth Control/ Protection: Condom, OCP   Other Topics Concern  . Not on file   Social History Narrative   [place]   [place]   Lives with her husband and 4 children.        Review of Systems     Objective:   Physical Exam   HEENT:  PERRL, EOMI,  discs sharp, right temporal pterygium that is just involving outer rim of iris and is  with slight inflammation.  Left eye with smaller temporal pterygium, no injection. TMs pearly gray, throat without injection, nasal mucosa without swelling. Neck:  Supple, no adenopathy Chest:  CTA CV:  RRR without murmur or rub, radial pulses normal and equal   Right temporal pterygium with mild injection      Assessment & Plan:  1.  Right temporal pterygium:  Mildly injected.  Referral to ophthalmologist.  2.  Elevated BP:  No treatment for now.  Reassess when returns for CPE.  To work on healthy eating and some weight loss.  Increase physical activity.

## 2015-11-12 NOTE — Patient Instructions (Signed)

## 2015-11-15 ENCOUNTER — Encounter: Payer: Self-pay | Admitting: Internal Medicine

## 2016-02-04 ENCOUNTER — Ambulatory Visit (INDEPENDENT_AMBULATORY_CARE_PROVIDER_SITE_OTHER): Payer: Self-pay | Admitting: Internal Medicine

## 2016-02-04 ENCOUNTER — Encounter: Payer: Self-pay | Admitting: Internal Medicine

## 2016-02-04 VITALS — BP 128/90 | HR 72 | Resp 16 | Ht 62.5 in | Wt 150.0 lb

## 2016-02-04 DIAGNOSIS — Z0000 Encounter for general adult medical examination without abnormal findings: Secondary | ICD-10-CM

## 2016-02-04 DIAGNOSIS — Z1239 Encounter for other screening for malignant neoplasm of breast: Secondary | ICD-10-CM

## 2016-02-04 DIAGNOSIS — E781 Pure hyperglyceridemia: Secondary | ICD-10-CM

## 2016-02-04 NOTE — Patient Instructions (Addendum)
Tome un vaso de agua antes de cada comida Tome un minimo de 6 a 8 vasos de agua diarios Coma tres veces al dia Coma una proteina y Neomia Dearuna grasa saludable con comida.  (huevos, pescado, pollo, pavo, y limite carnes rojas Coma 5 porciones diarias de legumbres.  Mezcle los colores Coma 2 porciones diarias de frutas con cascara cuando sea comestible Use platos pequeos Suelte su tenedor o cuchara despues de cada mordida hata que se mastique y se trague Come en la mesa con amigos o familiares por lo menos una vez al dia Apague la televisin y aparatos electrnicos durante la comida  Su objetivo debe ser perder Neomia Dearuna libra por semana  Return stool cards in 2 weeks. Walk daily for 30 minutes

## 2016-02-04 NOTE — Progress Notes (Signed)
Subjective:    Patient ID: Sierra RiggsNelly E Zetino de Murphy, female    DOB: August 05, 1975, 40 y.o.   MRN: [id number]  HPI   Here for CPE without pap  Health Maintenance  1.  Last Pap:  2016 with Dr. Clearance CootsHarper:  Always normal.  Has been with her husband for 18 years.  2.  Last Mammogram:  Has never had  3.  SBE:  Checks twice monthly, before and after periods.  No changes.  4.  Guaiac Cards:  Never.  5.  Osteoporosis prevention:  Drinks milk once daily.  Eats one serving yogurt daily.  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  Immunization History  Administered Date(s) Administered  . Influenza,inj,Quad PF,36+ Mos 06/11/2013  . Tdap 03/17/2011     No current outpatient prescriptions on file.   No Known Allergies   Past Medical History  Diagnosis Date  . Wears glasses   . Gall stones   . PIH (pregnancy induced hypertension)    Past Surgical History  Procedure Laterality Date  . No past surgeries    . Cholecystectomy N/A 07/16/2014    Procedure: LAPAROSCOPIC CHOLECYSTECTOMY WITH INTRAOPERATIVE CHOLANGIOGRAM;  Surgeon: Harriette Bouillonhomas Cornett, MD;  Location: [facility];  Service: General;  Laterality: N/A;   Family History  Problem Relation Age of Onset  . Hypertension Mother   . Arthritis Mother   . Hypertension Father   . Hypertension Sister     Review of Systems  Constitutional: Negative for appetite change and fatigue.  Eyes: Negative for visual disturbance.  Respiratory: Negative for cough and shortness of breath.   Cardiovascular: Negative for chest pain, palpitations and leg swelling.  Gastrointestinal: Negative for abdominal pain, blood in stool, constipation and diarrhea.  Genitourinary: Negative for dysuria and menstrual problem.  Musculoskeletal: Negative for arthralgias.  Neurological: Negative for weakness, numbness and headaches.  Psychiatric/Behavioral: Negative for dysphoric mood and suicidal ideas. The patient is not nervous/anxious.       Objective:   Physical Exam  Constitutional: She is oriented to person, place, and time. She appears well-developed and well-nourished.  HENT:  Head: Normocephalic and atraumatic.  Right Ear: Hearing, tympanic membrane, external ear and ear canal normal.  Left Ear: Hearing, tympanic membrane, external ear and ear canal normal.  Nose: Nose normal.  Mouth/Throat: Uvula is midline, oropharynx is clear and moist and mucous membranes are normal.  Eyes: Conjunctivae and EOM are normal. Pupils are equal, round, and reactive to light.  Discs sharp bilaterally Pterygium, right temporal eye.    Neck: Normal range of motion. Neck supple. No thyroid mass and no thyromegaly present.  Cardiovascular: Normal rate, regular rhythm, S1 normal and S2 normal.  Exam reveals no S3, no S4 and no friction rub.   No murmur heard. No carotid bruits.  Carotid, radial, femoral, DP and PT pulses normal and equal.   Pulmonary/Chest: Effort normal and breath sounds normal. Right breast exhibits no inverted nipple, no mass, no nipple discharge, no skin change and no tenderness. Left breast exhibits no inverted nipple, no mass, no nipple discharge, no skin change and no tenderness.  Abdominal: Soft. Bowel sounds are normal. She exhibits no mass. There is no hepatosplenomegaly. There is no tenderness. No hernia.  Genitourinary: Vagina normal and uterus normal. Rectal exam shows no mass, no tenderness, anal tone normal and guaiac negative stool. Cervix exhibits no motion tenderness and no discharge. Right adnexum displays no mass and no tenderness. Left adnexum displays no mass and  no tenderness.  Musculoskeletal: Normal range of motion.  Lymphadenopathy:       Head (right side): No submental and no submandibular adenopathy present.       Head (left side): No submental and no submandibular adenopathy present.    She has no cervical adenopathy.    She has no axillary adenopathy.       Right: No inguinal and no supraclavicular  adenopathy present.       Left: No inguinal and no supraclavicular adenopathy present.  Neurological: She is alert and oriented to person, place, and time. She has normal strength and normal reflexes. No cranial nerve deficit or sensory deficit. Coordination and gait normal.  Skin: Skin is warm and dry. No lesion and no rash noted.  Psychiatric: She has a normal mood and affect. Her speech is normal and behavior is normal. Judgment and thought content normal.          Assessment & Plan:  1.  CPE without pap: No concerning findings Guaiac Cards x 3, to return in 2 weeks Mammogram CBC, CMP, FLP  2.  Borderline BP:  To return in next 2 weeks for repeat bp check with nurse.

## 2016-02-05 LAB — CBC WITH DIFFERENTIAL/PLATELET
BASOS ABS: 0 10*3/uL (ref 0.0–0.2)
Basos: 0 %
EOS (ABSOLUTE): 0.1 10*3/uL (ref 0.0–0.4)
Eos: 1 %
HEMATOCRIT: 40.1 % (ref 34.0–46.6)
HEMOGLOBIN: 13.3 g/dL (ref 11.1–15.9)
Immature Grans (Abs): 0 10*3/uL (ref 0.0–0.1)
Immature Granulocytes: 0 %
LYMPHS ABS: 2.9 10*3/uL (ref 0.7–3.1)
Lymphs: 39 %
MCH: 25.9 pg — AB (ref 26.6–33.0)
MCHC: 33.2 g/dL (ref 31.5–35.7)
MCV: 78 fL — AB (ref 79–97)
MONOCYTES: 5 %
Monocytes Absolute: 0.3 10*3/uL (ref 0.1–0.9)
Neutrophils Absolute: 4.2 10*3/uL (ref 1.4–7.0)
Neutrophils: 55 %
Platelets: 316 10*3/uL (ref 150–379)
RBC: 5.13 x10E6/uL (ref 3.77–5.28)
RDW: 14.1 % (ref 12.3–15.4)
WBC: 7.6 10*3/uL (ref 3.4–10.8)

## 2016-02-05 LAB — LIPID PANEL W/O CHOL/HDL RATIO
CHOLESTEROL TOTAL: 158 mg/dL (ref 100–199)
HDL: 53 mg/dL (ref >39)
LDL Calculated: 84 mg/dL (ref 0–99)
Triglycerides: 107 mg/dL (ref 0–149)
VLDL CHOLESTEROL CAL: 21 mg/dL (ref 5–40)

## 2016-02-05 LAB — COMPREHENSIVE METABOLIC PANEL
A/G RATIO: 2 (ref 1.2–2.2)
ALBUMIN: 4.8 g/dL (ref 3.5–5.5)
ALK PHOS: 68 IU/L (ref 39–117)
ALT: 18 IU/L (ref 0–32)
AST: 23 IU/L (ref 0–40)
BILIRUBIN TOTAL: 0.6 mg/dL (ref 0.0–1.2)
BUN / CREAT RATIO: 12 (ref 9–23)
BUN: 8 mg/dL (ref 6–24)
CHLORIDE: 101 mmol/L (ref 96–106)
CO2: 21 mmol/L (ref 18–29)
Calcium: 9.3 mg/dL (ref 8.7–10.2)
Creatinine, Ser: 0.65 mg/dL (ref 0.57–1.00)
GFR calc non Af Amer: 111 mL/min/{1.73_m2} (ref >59)
GFR, EST AFRICAN AMERICAN: 128 mL/min/{1.73_m2} (ref >59)
GLOBULIN, TOTAL: 2.4 g/dL (ref 1.5–4.5)
GLUCOSE: 85 mg/dL (ref 65–99)
POTASSIUM: 4.4 mmol/L (ref 3.5–5.2)
SODIUM: 140 mmol/L (ref 134–144)
TOTAL PROTEIN: 7.2 g/dL (ref 6.0–8.5)

## 2016-02-09 ENCOUNTER — Other Ambulatory Visit (INDEPENDENT_AMBULATORY_CARE_PROVIDER_SITE_OTHER): Payer: Self-pay | Admitting: Internal Medicine

## 2016-02-09 DIAGNOSIS — Z1211 Encounter for screening for malignant neoplasm of colon: Secondary | ICD-10-CM

## 2016-02-09 LAB — POC HEMOCCULT BLD/STL (HOME/3-CARD/SCREEN)
Card #2 Fecal Occult Blod, POC: NEGATIVE
Card #3 Fecal Occult Blood, POC: NEGATIVE
FECAL OCCULT BLD: NEGATIVE

## 2016-02-18 ENCOUNTER — Ambulatory Visit: Payer: Self-pay

## 2016-02-18 VITALS — BP 130/88 | HR 70

## 2016-02-18 DIAGNOSIS — IMO0001 Reserved for inherently not codable concepts without codable children: Secondary | ICD-10-CM

## 2016-02-18 DIAGNOSIS — R030 Elevated blood-pressure reading, without diagnosis of hypertension: Principal | ICD-10-CM

## 2017-03-01 ENCOUNTER — Ambulatory Visit (INDEPENDENT_AMBULATORY_CARE_PROVIDER_SITE_OTHER): Payer: Self-pay | Admitting: Internal Medicine

## 2017-03-01 ENCOUNTER — Encounter: Payer: Self-pay | Admitting: Internal Medicine

## 2017-03-01 VITALS — BP 138/80 | HR 70 | Resp 12 | Ht 62.0 in | Wt 155.0 lb

## 2017-03-01 DIAGNOSIS — H547 Unspecified visual loss: Secondary | ICD-10-CM

## 2017-03-01 DIAGNOSIS — Z1239 Encounter for other screening for malignant neoplasm of breast: Secondary | ICD-10-CM

## 2017-03-01 DIAGNOSIS — Z1231 Encounter for screening mammogram for malignant neoplasm of breast: Secondary | ICD-10-CM

## 2017-03-01 DIAGNOSIS — L853 Xerosis cutis: Secondary | ICD-10-CM

## 2017-03-01 DIAGNOSIS — H11153 Pinguecula, bilateral: Secondary | ICD-10-CM

## 2017-03-01 HISTORY — DX: Pinguecula, bilateral: H11.153

## 2017-03-01 NOTE — Progress Notes (Signed)
   Subjective:    Patient ID: Sierra RiggsNelly E Zetino de Murphy, female    DOB: [birth year], 41 y.o.   MRN: [id number]  HPI   Would like to get set up for pap:  Due in November of this year.  Has never had a mammogram.  Having difficulties with near vision.  Has to hold things farther away to focus. Has not tried reading glasses.  Would like an eye referral  No outpatient prescriptions have been marked as taking for the 03/01/17 encounter (Office Visit) with Julieanne MansonMulberry, Jacora Hopkins, MD.    No Known Allergies           Review of Systems     Objective:   Physical Exam  NAD HEENT:  PERRL, EOMI, Pinguecula bilaterally, discs sharp.  Conjunctivae without injection. Neck:  Supple, No adenopathy, no thyromegaly Chest:  CTA CV:  RRR without murmur or rub, radial pulses normal and equal LE:  No edema Skin: dry on forearms       Assessment & Plan:  1.  Decreased Visual Acuity:  Optometry referral.  Encouraged to try out reading glasses in the meantime  2.  Dry skin:  Eucerin cream with eczema relief after shower daily.  3.  HM:  Schedule Mammogram.  CPE with pap in November with fasting labs just prior

## 2017-03-01 NOTE — Patient Instructions (Signed)
Eucerin Cream for Eczema Relief 

## 2017-07-03 ENCOUNTER — Other Ambulatory Visit: Payer: Self-pay

## 2017-07-03 DIAGNOSIS — E781 Pure hyperglyceridemia: Secondary | ICD-10-CM

## 2017-07-03 DIAGNOSIS — Z0000 Encounter for general adult medical examination without abnormal findings: Secondary | ICD-10-CM

## 2017-07-04 LAB — COMPREHENSIVE METABOLIC PANEL
ALBUMIN: 4.7 g/dL (ref 3.5–5.5)
ALK PHOS: 65 IU/L (ref 39–117)
ALT: 17 IU/L (ref 0–32)
AST: 23 IU/L (ref 0–40)
Albumin/Globulin Ratio: 1.8 (ref 1.2–2.2)
BILIRUBIN TOTAL: 0.7 mg/dL (ref 0.0–1.2)
BUN / CREAT RATIO: 14 (ref 9–23)
BUN: 11 mg/dL (ref 6–24)
CHLORIDE: 102 mmol/L (ref 96–106)
CO2: 20 mmol/L (ref 20–29)
Calcium: 9.3 mg/dL (ref 8.7–10.2)
Creatinine, Ser: 0.76 mg/dL (ref 0.57–1.00)
GFR calc Af Amer: 111 mL/min/{1.73_m2} (ref >59)
GFR calc non Af Amer: 96 mL/min/{1.73_m2} (ref >59)
GLOBULIN, TOTAL: 2.6 g/dL (ref 1.5–4.5)
GLUCOSE: 86 mg/dL (ref 65–99)
Potassium: 4.6 mmol/L (ref 3.5–5.2)
SODIUM: 140 mmol/L (ref 134–144)
Total Protein: 7.3 g/dL (ref 6.0–8.5)

## 2017-07-04 LAB — LIPID PANEL W/O CHOL/HDL RATIO
CHOLESTEROL TOTAL: 154 mg/dL (ref 100–199)
HDL: 50 mg/dL (ref >39)
LDL CALC: 85 mg/dL (ref 0–99)
TRIGLYCERIDES: 94 mg/dL (ref 0–149)
VLDL Cholesterol Cal: 19 mg/dL (ref 5–40)

## 2017-07-04 LAB — CBC WITH DIFFERENTIAL/PLATELET
Basophils Absolute: 0 10*3/uL (ref 0.0–0.2)
Basos: 0 %
EOS (ABSOLUTE): 0.1 10*3/uL (ref 0.0–0.4)
EOS: 1 %
HEMATOCRIT: 42 % (ref 34.0–46.6)
HEMOGLOBIN: 13.7 g/dL (ref 11.1–15.9)
IMMATURE GRANS (ABS): 0 10*3/uL (ref 0.0–0.1)
Immature Granulocytes: 0 %
LYMPHS ABS: 3.1 10*3/uL (ref 0.7–3.1)
LYMPHS: 37 %
MCH: 26.2 pg — ABNORMAL LOW (ref 26.6–33.0)
MCHC: 32.6 g/dL (ref 31.5–35.7)
MCV: 81 fL (ref 79–97)
MONOCYTES: 6 %
Monocytes Absolute: 0.5 10*3/uL (ref 0.1–0.9)
Neutrophils Absolute: 4.9 10*3/uL (ref 1.4–7.0)
Neutrophils: 56 %
Platelets: 318 10*3/uL (ref 150–379)
RBC: 5.22 x10E6/uL (ref 3.77–5.28)
RDW: 14.3 % (ref 12.3–15.4)
WBC: 8.6 10*3/uL (ref 3.4–10.8)

## 2017-09-05 ENCOUNTER — Ambulatory Visit: Payer: Self-pay | Admitting: Internal Medicine

## 2017-09-05 ENCOUNTER — Encounter: Payer: Self-pay | Admitting: Internal Medicine

## 2017-09-05 VITALS — BP 122/88 | HR 76 | Resp 12 | Ht 62.0 in | Wt 157.0 lb

## 2017-09-05 DIAGNOSIS — Z124 Encounter for screening for malignant neoplasm of cervix: Secondary | ICD-10-CM

## 2017-09-05 DIAGNOSIS — N9489 Other specified conditions associated with female genital organs and menstrual cycle: Secondary | ICD-10-CM

## 2017-09-05 DIAGNOSIS — Z1239 Encounter for other screening for malignant neoplasm of breast: Secondary | ICD-10-CM

## 2017-09-05 DIAGNOSIS — Z0000 Encounter for general adult medical examination without abnormal findings: Secondary | ICD-10-CM

## 2017-09-05 DIAGNOSIS — Z1231 Encounter for screening mammogram for malignant neoplasm of breast: Secondary | ICD-10-CM

## 2017-09-05 DIAGNOSIS — N898 Other specified noninflammatory disorders of vagina: Secondary | ICD-10-CM

## 2017-09-05 DIAGNOSIS — O139 Gestational [pregnancy-induced] hypertension without significant proteinuria, unspecified trimester: Secondary | ICD-10-CM | POA: Insufficient documentation

## 2017-09-05 DIAGNOSIS — N949 Unspecified condition associated with female genital organs and menstrual cycle: Secondary | ICD-10-CM

## 2017-09-05 LAB — POCT WET PREP WITH KOH
Clue Cells Wet Prep HPF POC: NEGATIVE
KOH PREP POC: NEGATIVE
RBC Wet Prep HPF POC: NEGATIVE
TRICHOMONAS UA: NEGATIVE
Yeast Wet Prep HPF POC: NEGATIVE

## 2017-09-05 NOTE — Patient Instructions (Addendum)

## 2017-09-05 NOTE — Progress Notes (Signed)
Subjective:    Patient ID: [name], female    DOB: [birth year], 42 y.o.   MRN: [id number]  HPI   CPE with pap  1.  Pap: Last pap 06/05/2014 and normal.  Never abnormal.  No family history of cervical cancer.   2.  Mammogram:  Never.  No family history of breast cancer.  3.  Osteoprevention:  5 servings of milk and cheese daily.  Walks and dances 2-3 times weekly for 30-40 minutes.    4.  Guaiac Cards:  Performed in 2017 and negative.  No family history of colon cancer.  5.  Colonoscopy:  Never.  Family history as above.  6.  Immunizations:   Immunization History  Administered Date(s) Administered  . Influenza,inj,Quad PF,6+ Mos 06/11/2013  . Tdap 03/17/2011    7.  Glucose/Cholesterol:  Both recently checked in December 2018 and quite good.  Glucose was 86 and FLP as below.  Lipid Panel     Component Value Date/Time   CHOL 154 07/03/2017 0854   TRIG 94 07/03/2017 0854   HDL 50 07/03/2017 0854   CHOLHDL 3.4 09/24/2014 0924   VLDL 32 09/24/2014 0924   LDLCALC 85 07/03/2017 0854      Review of Systems  Constitutional: Negative for appetite change, fatigue and fever.  HENT: Negative for dental problem, hearing loss, postnasal drip and sore throat.        Had teeth cleaned at 1 month  Eyes: Positive for visual disturbance (Had optometry visit about 6 months ago.  Now with glasses.).       Has pingeculae  Respiratory: Negative for cough and shortness of breath.   Cardiovascular: Negative for chest pain, palpitations and leg swelling.  Gastrointestinal: Negative for abdominal pain, blood in stool (no melena), constipation and diarrhea.  Genitourinary: Negative for dysuria and vaginal discharge.       For past 6 months, having heavy period, back cramps, headache.  Periods are regular and last 3 days, just heavier. No blood clots.  Has taken Tylenol with minimal improvement.  Musculoskeletal: Negative for arthralgias.  Skin: Negative for rash.    Neurological: Negative for weakness and numbness.  Psychiatric/Behavioral: Negative for dysphoric mood and suicidal ideas. The patient is not nervous/anxious.        Objective:   Physical Exam  Constitutional: She is oriented to person, place, and time. She appears well-developed and well-nourished.  HENT:  Head: Normocephalic and atraumatic.  Right Ear: Hearing, tympanic membrane, external ear and ear canal normal.  Left Ear: Hearing, tympanic membrane, external ear and ear canal normal.  Nose: Nose normal.  Mouth/Throat: Uvula is midline, oropharynx is clear and moist and mucous membranes are normal.  Eyes: Conjunctivae and EOM are normal. Pupils are equal, round, and reactive to light.  Discs sharp bilaterally  Neck: Normal range of motion and full passive range of motion without pain. Neck supple. No thyromegaly present.  Cardiovascular: Normal rate, regular rhythm, S1 normal and S2 normal. Exam reveals no S3, no S4 and no friction rub.  No murmur heard. No carotid bruits.  Carotid, radial, femoral, DP and PT pulses normal and equal.   Pulmonary/Chest: Effort normal and breath sounds normal. Right breast exhibits no inverted nipple, no mass, no nipple discharge, no skin change and no tenderness. Left breast exhibits no inverted nipple, no mass, no nipple discharge, no skin change and no tenderness.  Abdominal: Soft. Bowel sounds are normal. She exhibits no mass. There is no hepatosplenomegaly.  There is no tenderness. No hernia.  Genitourinary:  Genitourinary Comments: Normal external genitalia.  Some mucousy discharge.  No uterine or right adnexal mass or tenderness.  Left adnexa nontender, but palpable enlargement of adnexa.  Musculoskeletal: Normal range of motion.  Lymphadenopathy:       Head (right side): No submental and no submandibular adenopathy present.       Head (left side): No submental and no submandibular adenopathy present.    She has no cervical adenopathy.    She  has no axillary adenopathy.       Right: No inguinal and no supraclavicular adenopathy present.       Left: No inguinal and no supraclavicular adenopathy present.  Neurological: She is alert and oriented to person, place, and time. She has normal strength and normal reflexes. No cranial nerve deficit or sensory deficit. Coordination and gait normal.  Skin: Skin is warm and dry. No rash noted.  Psychiatric: She has a normal mood and affect. Her speech is normal and behavior is normal. Judgment and thought content normal. Cognition and memory are normal.          Assessment & Plan:  1.  CPE with pap Mammogram scheduled Discussed calling in September for free flu clinic vaccination Cholesterol and glucose recently quite good. Guaiac Cards x 3 to return in 2 weeks. Encouraged Advance Directives.  2.  Overweight:  Discussed lifestyle change to improve.  3.  Left adnexal enlargement/asymptomatic: pelvic ultrasound.  4.  Vaginal discharge:  Send urine for GC/chlamydia.  White cells may be due recently completed period flow.

## 2017-09-07 LAB — CYTOLOGY - PAP

## 2017-09-08 LAB — GC/CHLAMYDIA PROBE AMP
Chlamydia trachomatis, NAA: NEGATIVE
Neisseria gonorrhoeae by PCR: NEGATIVE

## 2017-09-20 ENCOUNTER — Other Ambulatory Visit (INDEPENDENT_AMBULATORY_CARE_PROVIDER_SITE_OTHER): Payer: Self-pay

## 2017-09-20 DIAGNOSIS — Z1211 Encounter for screening for malignant neoplasm of colon: Secondary | ICD-10-CM

## 2017-09-20 LAB — POC HEMOCCULT BLD/STL (HOME/3-CARD/SCREEN)
Card #2 Fecal Occult Blod, POC: NEGATIVE
Card #3 Fecal Occult Blood, POC: NEGATIVE
FECAL OCCULT BLD: NEGATIVE

## 2017-10-18 ENCOUNTER — Ambulatory Visit
Admission: RE | Admit: 2017-10-18 | Discharge: 2017-10-18 | Disposition: A | Discharge status: Home / Self Care | Payer: No Typology Code available for payment source | Source: Ambulatory Visit | Attending: Internal Medicine | Admitting: Internal Medicine

## 2017-10-18 DIAGNOSIS — Z1239 Encounter for other screening for malignant neoplasm of breast: Secondary | ICD-10-CM

## 2018-03-06 ENCOUNTER — Ambulatory Visit: Payer: Self-pay | Admitting: Internal Medicine

## 2018-03-08 ENCOUNTER — Ambulatory Visit: Payer: Self-pay | Admitting: Internal Medicine

## 2018-03-28 ENCOUNTER — Encounter: Payer: Self-pay | Admitting: Internal Medicine

## 2018-03-28 ENCOUNTER — Telehealth: Payer: Self-pay | Admitting: Internal Medicine

## 2018-03-28 ENCOUNTER — Ambulatory Visit: Payer: Self-pay | Admitting: Internal Medicine

## 2018-03-28 VITALS — BP 120/80 | HR 74 | Resp 14 | Ht 62.0 in | Wt 152.0 lb

## 2018-03-28 DIAGNOSIS — E663 Overweight: Secondary | ICD-10-CM

## 2018-03-28 DIAGNOSIS — N949 Unspecified condition associated with female genital organs and menstrual cycle: Secondary | ICD-10-CM

## 2018-03-28 NOTE — Telephone Encounter (Signed)
Spoke to patient today to inform about [facility] at 2:30 pm. Pt- to arrive at 2:15pm with full bladder. Pt. Confirmed

## 2018-03-28 NOTE — Progress Notes (Signed)
   Subjective:    Patient ID: [name], female    DOB: Dec 08, 1975, 42 y.o.   MRN: [id number]  HPI   1.  Left adnexal mass:  [facility] her [facility] February.  Not clear why this has not been performed yet.  Patient states she never received a call. Checking with [facility]. She is not having any discomfort in that area. No dysuria.  No problems with her regular periods.  Started a period today.   2.  Overweight:  Has lost 5 lbs since last visit in February.  Her BMI is down about 1 point to just under 28. She is walking regularly--30 minutes 5 days a week.  She is not drinking soda.  Has cut back on juice and bread.   Still eating a fair amount of processed carbs:  Pan dulce and white bread.  Eating pan dulce 2 pieces 4 times weekly.  Current Meds  Medication Sig  . acetaminophen (TYLENOL) 500 MG tablet Take 500 mg by mouth every 6 (six) hours as needed.  Marland Kitchen. ibuprofen (ADVIL,MOTRIN) 200 MG tablet Take 200 mg by mouth every 6 (six) hours as needed.    No Known Allergies  Review of Systems     Objective:   Physical Exam Lungs:  CTA CV:  RRR without murmur or rub Abd:  S, NT, No HSM or mass, + BS GU:  Bloody show with menstruation.  Bimanual exam again with a bit of fullness in left adnexal area.  No definitive mass, however.  Nontender.  No uterine or right adnexal mass or tenderness.       Assessment & Plan:  1.  Overweight:  Going in correct direction with lifestyle changes and weight, but would like her to continue to make changes for entire family and herself. Discussed small goals with diet, particularly and physical activity.  2. Left adnexal fullness:  [facility], will [facility] and apply for financial assistance subsequently.

## 2018-03-28 NOTE — Patient Instructions (Addendum)
Tome un vaso de agua antes de cada comida Tome un minimo de 6 a 8 vasos de agua diarios Coma tres veces al dia Coma una proteina y Neomia Dearuna grasa saludable con comida.  (huevos, pescado, pollo, pavo, y limite carnes rojas Coma 5 porciones diarias de legumbres.  Mezcle los colores Coma 2 porciones diarias de frutas con cascara cuando sea comestible Use platos pequeos Suelte su tenedor o cuchara despues de cada mordida hata que se mastique y se trague Come en la mesa con amigos o familiares por lo menos una vez al dia Apague la televisin y aparatos electrnicos durante la comida  Su objetivo debe ser perder una libra por semana  Estudios recientes indican que las personas quienes consumen todos de sus calorias durante 12 horas se bajan de pesocon Mas eficiencia.  Por ejemplo, si Usted come su primera comida a las 7:00 a.m., su comida final del dia se debe completar antes de las 7:00 p.m.  Call if you do not hear from [facility] about the ultrasound by end of next week.

## 2018-04-03 ENCOUNTER — Ambulatory Visit (HOSPITAL_COMMUNITY)
Admission: RE | Admit: 2018-04-03 | Discharge: 2018-04-03 | Disposition: A | Discharge status: Home / Self Care | Payer: No Typology Code available for payment source | Source: Ambulatory Visit | Attending: Internal Medicine | Admitting: Internal Medicine

## 2018-04-03 DIAGNOSIS — N949 Unspecified condition associated with female genital organs and menstrual cycle: Secondary | ICD-10-CM | POA: Insufficient documentation

## 2018-04-20 ENCOUNTER — Encounter: Payer: Self-pay | Admitting: Internal Medicine

## 2018-08-26 ENCOUNTER — Encounter: Payer: Self-pay | Admitting: Internal Medicine

## 2018-09-28 ENCOUNTER — Ambulatory Visit: Payer: Self-pay | Admitting: Internal Medicine

## 2018-09-28 ENCOUNTER — Encounter: Payer: Self-pay | Admitting: Internal Medicine

## 2018-09-28 VITALS — BP 124/82 | HR 78 | Resp 12 | Ht 62.0 in | Wt 151.0 lb

## 2018-09-28 DIAGNOSIS — E663 Overweight: Secondary | ICD-10-CM

## 2018-09-28 DIAGNOSIS — N9489 Other specified conditions associated with female genital organs and menstrual cycle: Secondary | ICD-10-CM

## 2018-09-28 DIAGNOSIS — N949 Unspecified condition associated with female genital organs and menstrual cycle: Secondary | ICD-10-CM

## 2018-09-28 NOTE — Progress Notes (Signed)
    Subjective:    Patient ID: [name], female   DOB: Jul 08, 1976, 43 y.o.   MRN: [id number]   HPI   Interpreted.  Overweight:  Walks 4 times weekly for 30-60 minutes.  When it's cold, she does not walk.   She did put her dietary goals on calendar Down 1 lb.  History of adnexal fullness last September--not clear we were ever able to get hold of her to notify of normal pelvic ultrasound. Shared normal results with her today.  Current Meds  Medication Sig  . acetaminophen (TYLENOL) 500 MG tablet Take 500 mg by mouth every 6 (six) hours as needed.  Marland Kitchen ibuprofen (ADVIL,MOTRIN) 200 MG tablet Take 200 mg by mouth every 6 (six) hours as needed.   No Known Allergies   Review of Systems    Objective:   BP 124/82 (BP Location: Left Arm, Patient Position: Sitting, Cuff Size: Normal)   Pulse 78   Resp 12   Ht 5\' 2"  (1.575 m)   Wt 151 lb (68.5 kg)   LMP 08/30/2018   BMI 27.62 kg/m   Physical Exam  NAD Lungs:  CTA CV:  RRR without murmur or rub.  Radial pulses normal and equal   Assessment & Plan   1.  Overweight:  Not clear she is motivated to lose weight.  Again, went over making small goals to maintain lifelong with diet and daily physical activity.  2.  History of adnexal fullness: no concerning findings on pelvic U/S last September.

## 2019-01-04 ENCOUNTER — Encounter: Payer: Self-pay | Admitting: Internal Medicine

## 2019-02-25 ENCOUNTER — Other Ambulatory Visit: Payer: Self-pay

## 2019-02-25 ENCOUNTER — Ambulatory Visit: Payer: Self-pay | Admitting: Internal Medicine

## 2019-02-25 ENCOUNTER — Encounter: Payer: Self-pay | Admitting: Internal Medicine

## 2019-02-25 VITALS — BP 132/88 | HR 90 | Resp 12 | Ht 62.0 in | Wt 156.0 lb

## 2019-02-25 DIAGNOSIS — Z0000 Encounter for general adult medical examination without abnormal findings: Secondary | ICD-10-CM

## 2019-02-25 DIAGNOSIS — Z9189 Other specified personal risk factors, not elsewhere classified: Secondary | ICD-10-CM

## 2019-02-25 NOTE — Progress Notes (Signed)
Subjective:    Patient ID: Sierra RiggsNelly E Zetino de Murphy, female   DOB: 04/25/1976, 43 y.o.   MRN: [id number]   HPI   CPE without pap  1.  Pap:  Normal 09/05/2017.  Always normal.   2.  Mammogram:  States she had a mammogram done in 2019, but do not see anything in her record.  One was ordered, but do not see she actually went. She states she recently received a reminder letter.  No family history of breast cancer.  3.  Osteoprevention:  She has 3 servings of dairy daily.  Walking and running 4 times for 30-35 minutes.  4.  Guaiac Cards:  Performed last year and negative.  5.  Colonoscopy:  Never.  No family history of colon cancer.  6.  Immunizations: Immunization History  Administered Date(s) Administered  . Influenza,inj,Quad PF,6+ Mos 06/11/2013  . Tdap 03/17/2011     7.  Glucose/Cholesterol:  Blood glucose always fine.  Cholesterol panel was at goal in 2018 Lipid Panel     Component Value Date/Time   CHOL 154 07/03/2017 0854   TRIG 94 07/03/2017 0854   HDL 50 07/03/2017 0854   CHOLHDL 3.4 09/24/2014 0924   VLDL 32 09/24/2014 0924   LDLCALC 85 07/03/2017 0854     Current Meds  Medication Sig  . acetaminophen (TYLENOL) 500 MG tablet Take 500 mg by mouth every 6 (six) hours as needed.  Marland Kitchen. ibuprofen (ADVIL,MOTRIN) 200 MG tablet Take 200 mg by mouth every 6 (six) hours as needed.   No Known Allergies   Past Medical History:  Diagnosis Date  . Gall stones   . PIH (pregnancy induced hypertension)   . 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 Wears glasses     Past Surgical History:  Procedure Laterality Date  . CHOLECYSTECTOMY N/A 07/16/2014   Procedure: LAPAROSCOPIC CHOLECYSTECTOMY WITH INTRAOPERATIVE CHOLANGIOGRAM;  Surgeon: Harriette Bouillonhomas Cornett, MD;  Location: [facility];  Service: General;  Laterality: N/A;    Family History  Problem Relation Age of Onset  . Hypertension Mother   . Arthritis Mother   . Hypertension Father   . Hypertension Sister      Social History   Socioeconomic History  . Marital status: Married    Spouse name: Royston BakeYony  . Number of children: 4  . Years of education: 9816  . Highest education level: Not on file  Occupational History  . 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 Financial resource strain: Not on file  . Food insecurity    Worry: Never true    Inability: Never true  . Transportation needs    Medical: No    Non-medical: No  Tobacco Use  . Smoking status: Never Smoker  . Smokeless tobacco: Never Used  Substance and Sexual Activity  . Alcohol use: No    Alcohol/week: 0.0 standard drinks  . Drug use: No  . Sexual activity: Yes    Partners: Male    Birth control/protection: Condom  Lifestyle  . Physical activity    Days per week: 3 days    Minutes per session: 40 min  . Stress: Only a little  Relationships  . Social connections    Talks on phone: More than three times a week    Gets together: More than three times a week    Attends religious service: More than 4 times per year    Active member of club or organization: No    Attends meetings  of clubs or organizations: Never    Relationship status: Married  . Intimate partner violence    Fear of current or ex partner: No    Emotionally abused: No    Physically abused: No    Forced sexual activity: No  Other Topics Concern  . Not on file  Social History Narrative   [place]   Lives with her husband and 4 children.      Review of Systems  Constitutional: Negative for appetite change, fatigue and fever.  HENT: Negative for dental problem, ear pain, hearing loss, rhinorrhea and sore throat.   Eyes: Positive for redness (Intermittently where she has a pterygium in left eye.).  Respiratory: Negative for cough and shortness of breath.   Cardiovascular: Negative for chest pain, palpitations and leg swelling.  Gastrointestinal: Negative for abdominal pain, blood in stool (No melena), constipation and diarrhea.   Genitourinary: Negative for dysuria and vaginal discharge.  Musculoskeletal: Negative for arthralgias.  Skin: Negative for rash.       No skin lesions with concerning changes.  Neurological: Positive for headaches (Sometimes.  Relieved with ibuprofen or tylenol). Negative for weakness and numbness.  Hematological: Does not bruise/bleed easily.  Psychiatric/Behavioral: Negative for dysphoric mood. The patient is not nervous/anxious.       Objective:   BP 132/88 (BP Location: Left Arm, Patient Position: Sitting, Cuff Size: Normal)   Pulse 90   Resp 12   Ht 5\' 2"  (1.575 m)   Wt 156 lb (70.8 kg)   LMP 02/18/2019   BMI 28.53 kg/m   Physical Exam  Constitutional: She is oriented to person, place, and time. She appears well-developed and well-nourished.  HENT:  Head: Normocephalic and atraumatic.  Right Ear: Hearing, tympanic membrane, external ear and ear canal normal.  Left Ear: Hearing, tympanic membrane, external ear and ear canal normal.  Nose: Nose normal.  Mouth/Throat: Uvula is midline, oropharynx is clear and moist and mucous membranes are normal.  Eyes: Pupils are equal, round, and reactive to light. Conjunctivae and EOM are normal.  Pterygium on temporal area of left eye. Discs sharp bilaterally.  Neck: Normal range of motion and full passive range of motion without pain. Neck supple. No thyromegaly present.  Cardiovascular: Normal rate, regular rhythm, S1 normal and S2 normal. Exam reveals no S3, no S4 and no friction rub.  No murmur heard. No carotid bruits.  Carotid, radial, femoral, DP and PT pulses normal and equal.   Pulmonary/Chest: Effort normal and breath sounds normal. Right breast exhibits no inverted nipple, no mass, no nipple discharge, no skin change and no tenderness. Left breast exhibits no inverted nipple, no mass, no nipple discharge, no skin change and no tenderness.  Abdominal: Soft. Bowel sounds are normal. She exhibits no mass. There is no  hepatosplenomegaly. There is no abdominal tenderness. No hernia.  Genitourinary:    Genitourinary Comments: Normal external female genitalia.   No uterine or adnexal mass or tenderness. Rectal:  No mass, heme negative light brown stool.    Musculoskeletal: Normal range of motion.  Lymphadenopathy:       Head (right side): No submental and no submandibular adenopathy present.       Head (left side): No submental and no submandibular adenopathy present.    She has no cervical adenopathy.    She has no axillary adenopathy.       Right: No inguinal and no supraclavicular adenopathy present.       Left: No inguinal  and no supraclavicular adenopathy present.  Neurological: She is alert and oriented to person, place, and time. She has normal strength and normal reflexes. No cranial nerve deficit or sensory deficit. Coordination and gait normal.  Skin: Skin is warm. Lesion (1/2 cm oval dark brown nevus at mid low neck/C7 area) noted. No rash noted.  Psychiatric: She has a normal mood and affect. Her speech is normal and behavior is normal. Judgment and thought content normal. Cognition and memory are normal.    .  Assessment & Plan  1.  CPE without pap Has letter to call in for Mammogram--she is to call if any difficulty getting this set up. Guaiac cards x 3 to return in 2 weeks. CBC, CMP Influenza vaccine in Sept/Oct.

## 2019-02-26 LAB — CBC WITH DIFFERENTIAL/PLATELET
Basophils Absolute: 0 10*3/uL (ref 0.0–0.2)
Basos: 0 %
EOS (ABSOLUTE): 0.1 10*3/uL (ref 0.0–0.4)
Eos: 1 %
Hematocrit: 39.8 % (ref 34.0–46.6)
Hemoglobin: 13.2 g/dL (ref 11.1–15.9)
Immature Grans (Abs): 0.1 10*3/uL (ref 0.0–0.1)
Immature Granulocytes: 1 %
Lymphocytes Absolute: 3.5 10*3/uL — ABNORMAL HIGH (ref 0.7–3.1)
Lymphs: 38 %
MCH: 26.1 pg — ABNORMAL LOW (ref 26.6–33.0)
MCHC: 33.2 g/dL (ref 31.5–35.7)
MCV: 79 fL (ref 79–97)
Monocytes Absolute: 0.6 10*3/uL (ref 0.1–0.9)
Monocytes: 6 %
Neutrophils Absolute: 4.9 10*3/uL (ref 1.4–7.0)
Neutrophils: 54 %
Platelets: 313 10*3/uL (ref 150–450)
RBC: 5.06 x10E6/uL (ref 3.77–5.28)
RDW: 12.2 % (ref 11.7–15.4)
WBC: 9.1 10*3/uL (ref 3.4–10.8)

## 2019-02-26 LAB — COMPREHENSIVE METABOLIC PANEL
ALT: 17 IU/L (ref 0–32)
AST: 20 IU/L (ref 0–40)
Albumin/Globulin Ratio: 2 (ref 1.2–2.2)
Albumin: 4.8 g/dL (ref 3.8–4.8)
Alkaline Phosphatase: 77 IU/L (ref 39–117)
BUN/Creatinine Ratio: 13 (ref 9–23)
BUN: 10 mg/dL (ref 6–24)
Bilirubin Total: 0.4 mg/dL (ref 0.0–1.2)
CO2: 25 mmol/L (ref 20–29)
Calcium: 9.5 mg/dL (ref 8.7–10.2)
Chloride: 99 mmol/L (ref 96–106)
Creatinine, Ser: 0.78 mg/dL (ref 0.57–1.00)
GFR calc Af Amer: 107 mL/min/{1.73_m2} (ref >59)
GFR calc non Af Amer: 93 mL/min/{1.73_m2} (ref >59)
Globulin, Total: 2.4 g/dL (ref 1.5–4.5)
Glucose: 83 mg/dL (ref 65–99)
Potassium: 4.6 mmol/L (ref 3.5–5.2)
Sodium: 140 mmol/L (ref 134–144)
Total Protein: 7.2 g/dL (ref 6.0–8.5)

## 2019-03-22 ENCOUNTER — Other Ambulatory Visit (INDEPENDENT_AMBULATORY_CARE_PROVIDER_SITE_OTHER): Payer: Self-pay

## 2019-03-22 ENCOUNTER — Other Ambulatory Visit: Payer: Self-pay

## 2019-03-22 DIAGNOSIS — Z1211 Encounter for screening for malignant neoplasm of colon: Secondary | ICD-10-CM

## 2019-03-22 LAB — POC HEMOCCULT BLD/STL (HOME/3-CARD/SCREEN)
Card #2 Fecal Occult Blod, POC: NEGATIVE
Card #3 Fecal Occult Blood, POC: NEGATIVE
Fecal Occult Blood, POC: NEGATIVE

## 2020-12-30 DIAGNOSIS — R87619 Unspecified abnormal cytological findings in specimens from cervix uteri: Secondary | ICD-10-CM | POA: Insufficient documentation

## 2020-12-30 HISTORY — DX: Unspecified abnormal cytological findings in specimens from cervix uteri: R87.619

## 2020-12-31 ENCOUNTER — Ambulatory Visit: Payer: Self-pay | Admitting: Internal Medicine

## 2020-12-31 ENCOUNTER — Other Ambulatory Visit: Payer: Self-pay

## 2020-12-31 ENCOUNTER — Other Ambulatory Visit: Payer: Self-pay | Admitting: Internal Medicine

## 2020-12-31 ENCOUNTER — Encounter: Payer: Self-pay | Admitting: Internal Medicine

## 2020-12-31 VITALS — BP 128/78 | HR 78 | Resp 12 | Ht 62.0 in | Wt 156.0 lb

## 2020-12-31 DIAGNOSIS — Z0000 Encounter for general adult medical examination without abnormal findings: Secondary | ICD-10-CM

## 2020-12-31 DIAGNOSIS — Z1231 Encounter for screening mammogram for malignant neoplasm of breast: Secondary | ICD-10-CM

## 2020-12-31 DIAGNOSIS — Z23 Encounter for immunization: Secondary | ICD-10-CM

## 2020-12-31 DIAGNOSIS — Z124 Encounter for screening for malignant neoplasm of cervix: Secondary | ICD-10-CM

## 2020-12-31 DIAGNOSIS — Z1159 Encounter for screening for other viral diseases: Secondary | ICD-10-CM

## 2020-12-31 DIAGNOSIS — Z9189 Other specified personal risk factors, not elsewhere classified: Secondary | ICD-10-CM

## 2020-12-31 NOTE — Progress Notes (Signed)
Subjective:    Patient ID: [name], female   DOB: 02/17/1976, 45 y.o.   MRN: [id number]   HPI   CPE with pap  1.  Pap:  Last pap 09/2017 and normal.    2.  Mammogram:  Last 10/18/2017 and normal.  G6P6 and did nurse all babies.  No hormonal birth control history.  No history of tobacco use.  No family history of breast cancer.   3.  Osteoprevention:  Drinks 2 glasses milk daily and can increase to 3-4 cups.  Walks 3 times weekly.    4.  Guaiac Cards:  Last performed 03/2019 and negative.    5.  Colonoscopy:  Never.  No family history of colon cancer.    6.  Immunizations:   Has not had Moderna COVID booster.  Did not get an influenza vaccine this year.    7.  Glucose/Cholesterol:  No issues with hyperglycemia or elevated cholesterol in past.  No check of glucose since 2020 and cholesterol since 2018.  Current Meds  Medication Sig  . acetaminophen (TYLENOL) 500 MG tablet Take 500 mg by mouth every 6 (six) hours as needed.  Marland Kitchen ibuprofen (ADVIL,MOTRIN) 200 MG tablet Take 200 mg by mouth every 6 (six) hours as needed.   No Known Allergies  Past Medical History:  Diagnosis Date  . Gall stones   . PIH (pregnancy induced hypertension)   . 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 Wears glasses     Past Surgical History:  Procedure Laterality Date  . CHOLECYSTECTOMY N/A 07/16/2014   Procedure: LAPAROSCOPIC CHOLECYSTECTOMY WITH INTRAOPERATIVE CHOLANGIOGRAM;  Surgeon: Harriette Bouillon, MD;  Location: [facility];  Service: General;  Laterality: N/A;   Family History  Problem Relation Age of Onset  . Hypertension Mother   . Arthritis Mother   . Hypertension Father   . Hypertension Sister     Family Status  Relation Name Status  . Mother  Alive, age 48y  . Father  Alive, age 63y  . Sister  Alive, age 26y  . Brother  Alive  . Daughter Nicanor Alcon, age 42y  . Son Neomia Glass, age 1y  . Sister  Alive  . Sister  Alive  . Brother  Alive  . Brother   Alive  . Brother  Alive  . Brother  Alive  . Brother  Alive  . Son Herbert Seta, age 26y  . Son Georg Ruddle, age 7y    Social History   Socioeconomic History  . Marital status: Married    Spouse name: Royston Bake  . Number of children: 4  . Years of education: 69  . Highest education level: Not on file  Occupational History  . Occupation: Engineer, production  Tobacco Use  . Smoking status: Never Smoker  . Smokeless tobacco: Never Used  Vaping Use  . Vaping Use: Never used  Substance and Sexual Activity  . Alcohol use: No    Alcohol/week: 0.0 standard drinks  . Drug use: No  . Sexual activity: Yes    Partners: Male    Birth control/protection: Condom  Other Topics Concern  . Not on file  Social History Narrative   Came to [place] In [place]   Lives with her husband and 4 children.   Social Determinants of Health   Financial Resource Strain: Low Risk   . Difficulty of Paying Living Expenses: Not hard at all  Food Insecurity: No Food Insecurity  . Worried About  Running Out of Food in the Last Year: Never true  . Ran Out of Food in the Last Year: Never true  Transportation Needs: No Transportation Needs  . Lack of Transportation (Medical): No  . Lack of Transportation (Non-Medical): No  Physical Activity: Not on file  Stress: Not on file  Social Connections: Not on file  Intimate Partner Violence: Not At Risk  . Fear of Current or Ex-Partner: No  . Emotionally Abused: No  . Physically Abused: No  . Sexually Abused: No     Review of Systems  Constitutional: Negative for appetite change and fatigue.  HENT: Negative for dental problem, hearing loss and sore throat.   Eyes: Negative for visual disturbance (wears glasses).  Respiratory: Negative for cough and shortness of breath.   Cardiovascular: Negative for chest pain, palpitations and leg swelling.  Gastrointestinal: Negative for abdominal pain and blood in stool (No melena or hematochezia).  Genitourinary: Negative for  dysuria, frequency and vaginal discharge.  Musculoskeletal: Negative for arthralgias.  Neurological: Negative for weakness and numbness.  Psychiatric/Behavioral: Negative for dysphoric mood. The patient is not nervous/anxious.       Objective:   BP 128/78 (BP Location: Left Arm, Patient Position: Sitting, Cuff Size: Normal)   Pulse 78   Resp 12   Ht 5\' 2"  (1.575 m)   Wt 156 lb (70.8 kg)   LMP 12/28/2020 (Exact Date) Comment: Regular  BMI 28.53 kg/m   Physical Exam HENT:     Head: Normocephalic and atraumatic.     Right Ear: Tympanic membrane, ear canal and external ear normal.     Left Ear: Tympanic membrane, ear canal and external ear normal.     Nose: Nose normal.     Mouth/Throat:     Mouth: Mucous membranes are moist.     Pharynx: Oropharynx is clear.  Eyes:     Extraocular Movements: Extraocular movements intact.     Conjunctiva/sclera: Conjunctivae normal.     Pupils: Pupils are equal, round, and reactive to light.     Funduscopic exam:    Right eye: Red reflex present.        Left eye: Red reflex present.    Comments: Discs sharp bilaterally  Neck:     Thyroid: No thyroid mass or thyromegaly.  Cardiovascular:     Rate and Rhythm: Normal rate and regular rhythm.     Heart sounds: S1 normal and S2 normal. No murmur heard. No friction rub. No S3 or S4 sounds.      Comments: No carotid bruits.  Carotid, radial, femoral, DP and PT pulses normal and equal.  Pulmonary:     Effort: Pulmonary effort is normal.     Breath sounds: Normal breath sounds.  Chest:  Breasts:     Right: No inverted nipple, mass, nipple discharge, skin change, axillary adenopathy or supraclavicular adenopathy.     Left: No inverted nipple, mass, nipple discharge, skin change, axillary adenopathy or supraclavicular adenopathy.    Abdominal:     General: Bowel sounds are normal.     Palpations: Abdomen is soft. There is no hepatomegaly, splenomegaly or mass.     Tenderness: There is no  abdominal tenderness.     Hernia: No hernia is present.  Genitourinary:    Comments: Normal external female genitalia. No cervical lesion No uterine or adnexal mass or tenderness. Musculoskeletal:        General: Normal range of motion.     Cervical back: Normal range of motion and  neck supple.     Right lower leg: No edema.     Left lower leg: No edema.  Lymphadenopathy:     Head:     Right side of head: No submental or submandibular adenopathy.     Left side of head: No submental or submandibular adenopathy.     Cervical: No cervical adenopathy.     Upper Body:     Right upper body: No supraclavicular or axillary adenopathy.     Left upper body: No supraclavicular or axillary adenopathy.     Lower Body: No right inguinal adenopathy. No left inguinal adenopathy.  Skin:    General: Skin is warm.     Capillary Refill: Capillary refill takes less than 2 seconds.     Findings: No rash.       Neurological:     General: No focal deficit present.     Mental Status: She is alert and oriented to person, place, and time.     Cranial Nerves: Cranial nerves are intact.     Sensory: Sensation is intact.     Motor: Motor function is intact.     Coordination: Coordination is intact.     Gait: Gait is intact.     Deep Tendon Reflexes: Reflexes are normal and symmetric.  Psychiatric:        Attention and Perception: Attention normal.        Mood and Affect: Mood normal.        Speech: Speech normal.        Behavior: Behavior normal.        Thought Content: Thought content normal.        Cognition and Memory: Cognition normal.        Judgment: Judgment normal.      Assessment & Plan   1.  CPE with pap Mammogram order sent Guaiac Cards x 3 to return in 2 weeks. Fasting labs:  FLP, CBC, CMP, Hepatitis C screening. Td today Encouraged Moderna COVID booster. Encouraged influenza vaccine in fall.  2.  Skin lesion, upper back midline:  Appears benign and patient states without  change for many years.  Monitor.    3.  Dental care:  Dental referral.

## 2021-01-01 LAB — CBC WITH DIFFERENTIAL/PLATELET
Basophils Absolute: 0 10*3/uL (ref 0.0–0.2)
Basos: 1 %
EOS (ABSOLUTE): 0.1 10*3/uL (ref 0.0–0.4)
Eos: 1 %
Hematocrit: 40.8 % (ref 34.0–46.6)
Hemoglobin: 13.3 g/dL (ref 11.1–15.9)
Immature Grans (Abs): 0 10*3/uL (ref 0.0–0.1)
Immature Granulocytes: 0 %
Lymphocytes Absolute: 3 10*3/uL (ref 0.7–3.1)
Lymphs: 35 %
MCH: 26.1 pg — ABNORMAL LOW (ref 26.6–33.0)
MCHC: 32.6 g/dL (ref 31.5–35.7)
MCV: 80 fL (ref 79–97)
Monocytes Absolute: 0.4 10*3/uL (ref 0.1–0.9)
Monocytes: 5 %
Neutrophils Absolute: 5 10*3/uL (ref 1.4–7.0)
Neutrophils: 58 %
Platelets: 349 10*3/uL (ref 150–450)
RBC: 5.1 x10E6/uL (ref 3.77–5.28)
RDW: 13 % (ref 11.7–15.4)
WBC: 8.5 10*3/uL (ref 3.4–10.8)

## 2021-01-01 LAB — COMPREHENSIVE METABOLIC PANEL
ALT: 12 IU/L (ref 0–32)
AST: 18 IU/L (ref 0–40)
Albumin/Globulin Ratio: 1.6 (ref 1.2–2.2)
Albumin: 4.5 g/dL (ref 3.8–4.8)
Alkaline Phosphatase: 77 IU/L (ref 44–121)
BUN/Creatinine Ratio: 16 (ref 9–23)
BUN: 10 mg/dL (ref 6–24)
Bilirubin Total: 0.4 mg/dL (ref 0.0–1.2)
CO2: 22 mmol/L (ref 20–29)
Calcium: 9.3 mg/dL (ref 8.7–10.2)
Chloride: 100 mmol/L (ref 96–106)
Creatinine, Ser: 0.64 mg/dL (ref 0.57–1.00)
Globulin, Total: 2.8 g/dL (ref 1.5–4.5)
Glucose: 69 mg/dL (ref 65–99)
Potassium: 4.5 mmol/L (ref 3.5–5.2)
Sodium: 140 mmol/L (ref 134–144)
Total Protein: 7.3 g/dL (ref 6.0–8.5)
eGFR: 110 mL/min/{1.73_m2} (ref >59)

## 2021-01-01 LAB — HEPATITIS C ANTIBODY: Hep C Virus Ab: 0.1 s/co ratio (ref 0.0–0.9)

## 2021-01-01 LAB — LIPID PANEL W/O CHOL/HDL RATIO
Cholesterol, Total: 163 mg/dL (ref 100–199)
HDL: 50 mg/dL (ref >39)
LDL Chol Calc (NIH): 86 mg/dL (ref 0–99)
Triglycerides: 156 mg/dL — ABNORMAL HIGH (ref 0–149)
VLDL Cholesterol Cal: 27 mg/dL (ref 5–40)

## 2021-01-07 LAB — CYTOLOGY - PAP

## 2021-01-15 ENCOUNTER — Other Ambulatory Visit (INDEPENDENT_AMBULATORY_CARE_PROVIDER_SITE_OTHER): Payer: Self-pay

## 2021-01-15 DIAGNOSIS — Z1211 Encounter for screening for malignant neoplasm of colon: Secondary | ICD-10-CM

## 2021-01-15 LAB — POC HEMOCCULT BLD/STL (HOME/3-CARD/SCREEN)
Card #2 Fecal Occult Blod, POC: NEGATIVE
Card #3 Fecal Occult Blood, POC: NEGATIVE
Fecal Occult Blood, POC: NEGATIVE

## 2021-01-15 NOTE — Addendum Note (Signed)
Addended by: Duayne Cal on: 01/15/2021 05:05 PM   Modules accepted: Orders

## 2021-02-08 ENCOUNTER — Other Ambulatory Visit: Payer: Self-pay | Admitting: Internal Medicine

## 2021-02-08 DIAGNOSIS — R87629 Unspecified abnormal cytological findings in specimens from vagina: Secondary | ICD-10-CM

## 2021-03-04 ENCOUNTER — Ambulatory Visit
Admission: RE | Admit: 2021-03-04 | Discharge: 2021-03-04 | Disposition: A | Discharge status: Home / Self Care | Payer: No Typology Code available for payment source | Source: Ambulatory Visit | Attending: Internal Medicine | Admitting: Internal Medicine

## 2021-03-04 ENCOUNTER — Other Ambulatory Visit: Payer: Self-pay

## 2021-03-04 DIAGNOSIS — Z1231 Encounter for screening mammogram for malignant neoplasm of breast: Secondary | ICD-10-CM

## 2021-11-20 IMAGING — MG MM DIGITAL SCREENING BILAT W/ TOMO AND CAD
8 series · 9 of 24 positions shown · non-contrast
Comparison: Previous exam(s).

CLINICAL DATA: Screening.

EXAM:
DIGITAL SCREENING BILATERAL MAMMOGRAM WITH TOMOSYNTHESIS AND CAD
TECHNIQUE: Bilateral screening digital craniocaudal and mediolateral oblique
mammograms were obtained. Bilateral screening digital breast
tomosynthesis was performed. The images were evaluated with
computer-aided detection.

[R CC synth-2D]
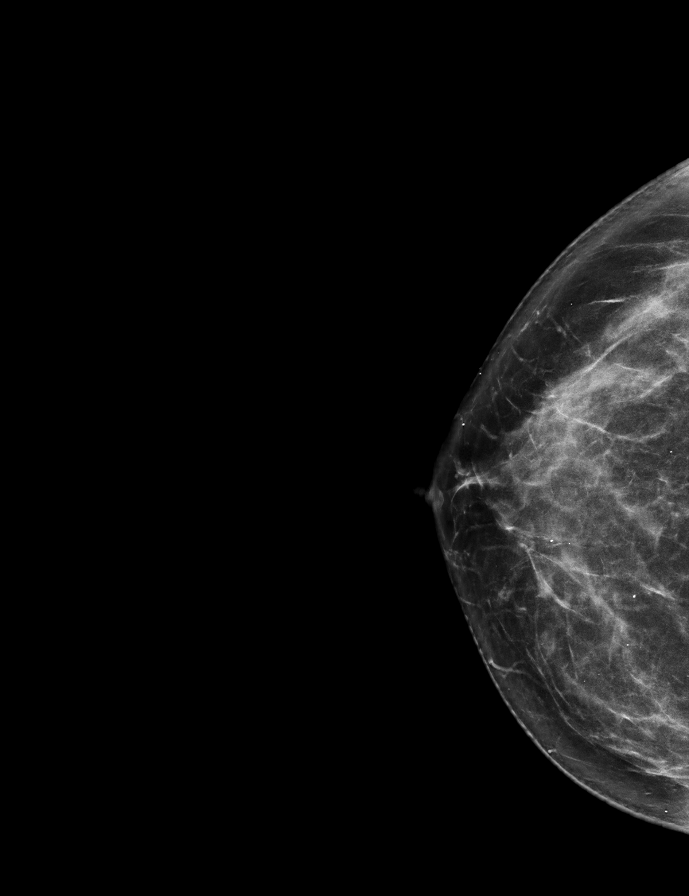

[R MLO synth-2D]
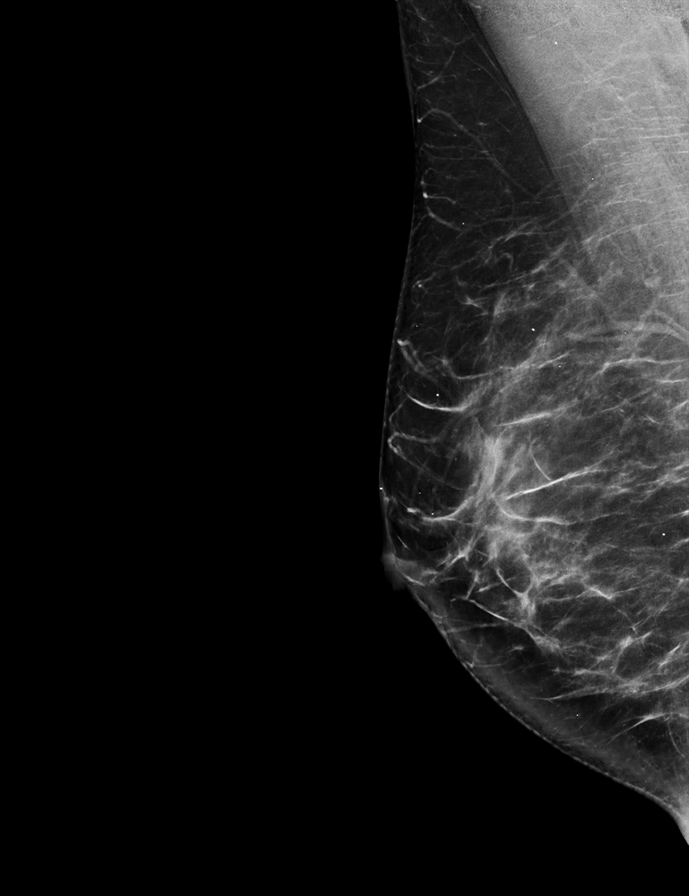

[L MLO synth-2D]
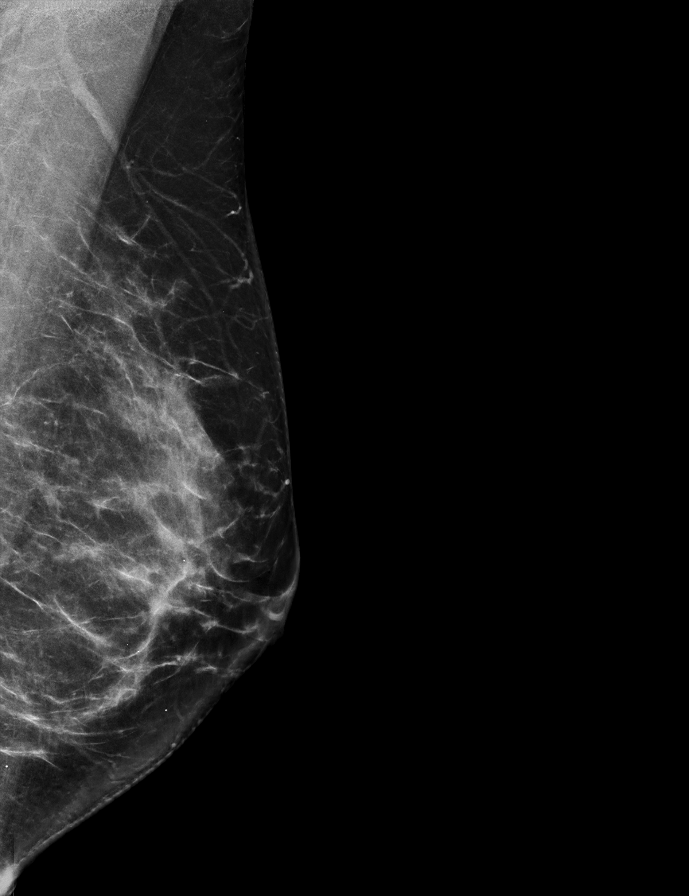

[L CC synth-2D]
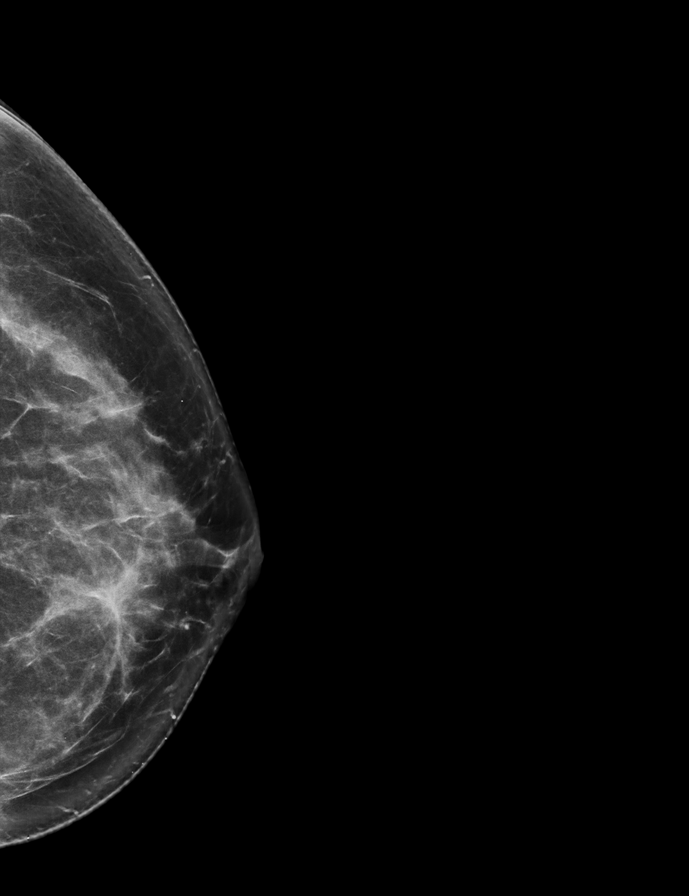

[R CC tomo · 2 of 72 frames shown]
[frame 24/72]
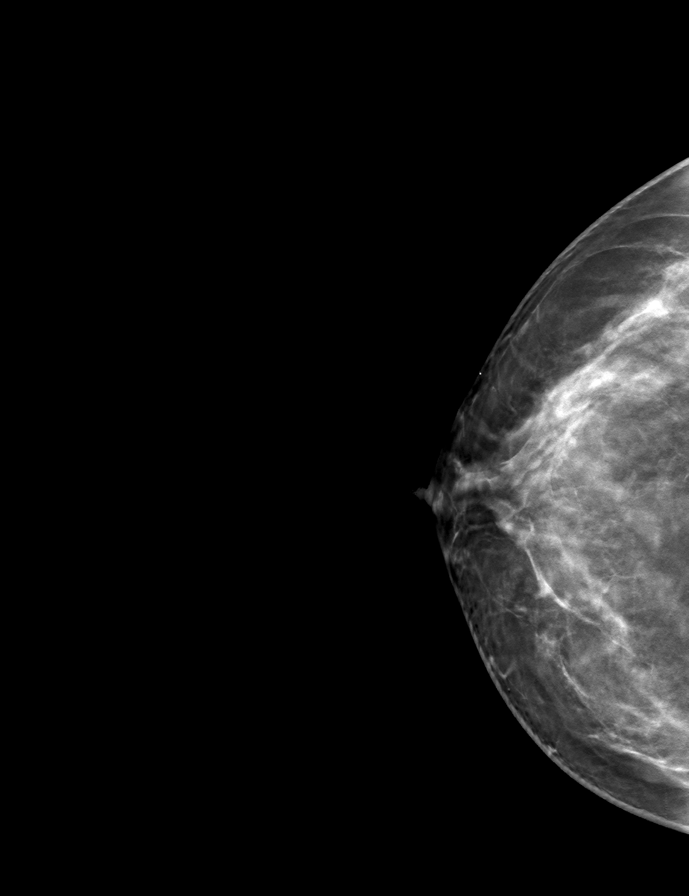
[frame 37/72]
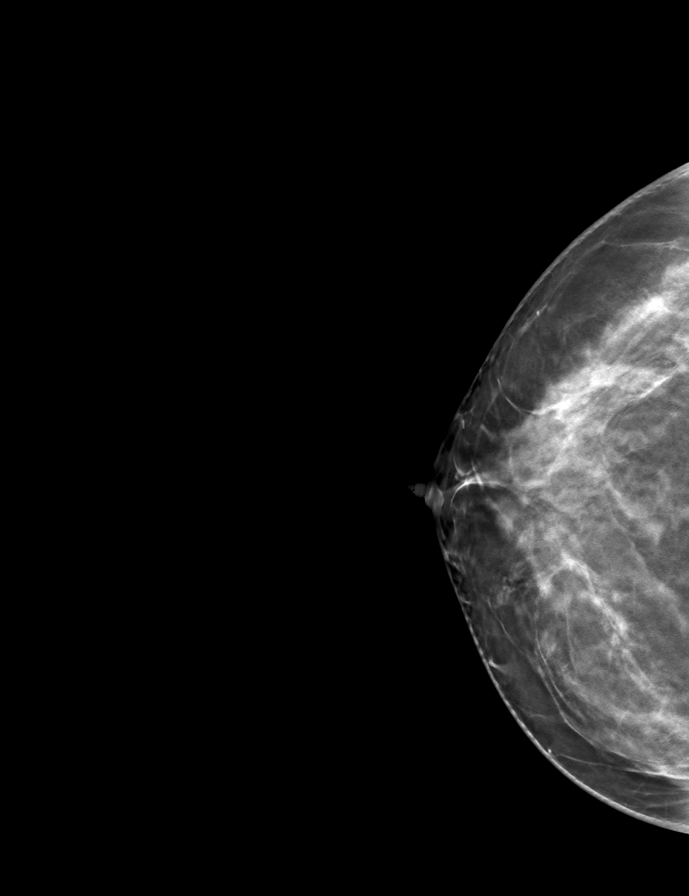

[R MLO tomo · tomo slice 38/75.0]
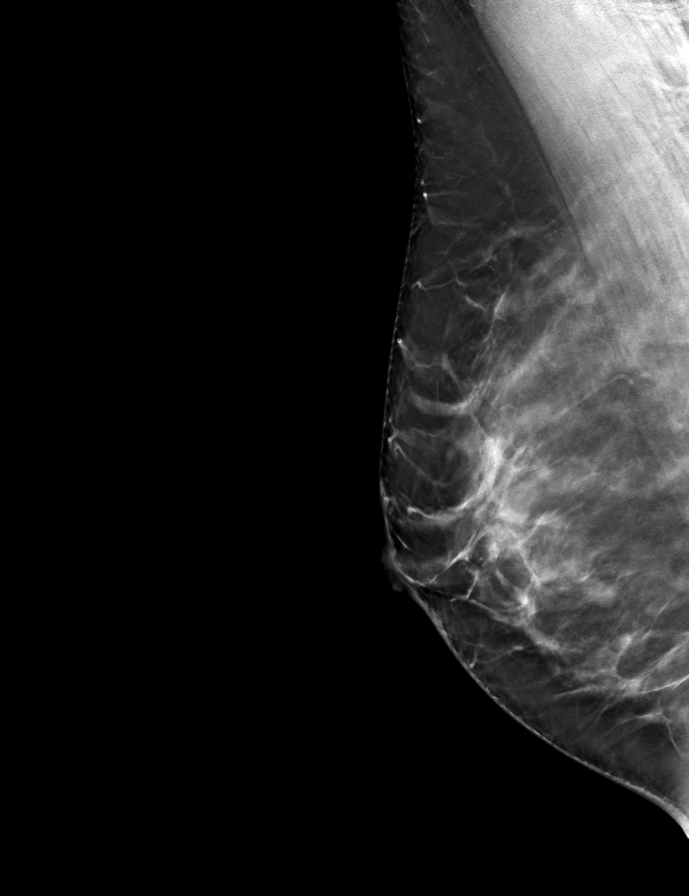

[L MLO tomo · tomo slice 39/76.0]
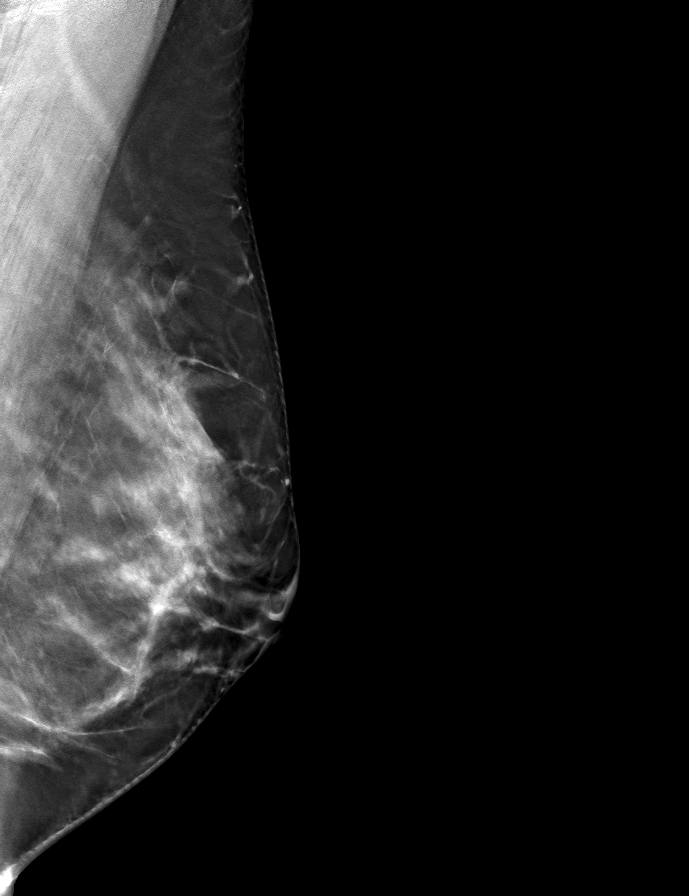

[L CC tomo · tomo slice 39/76.0]
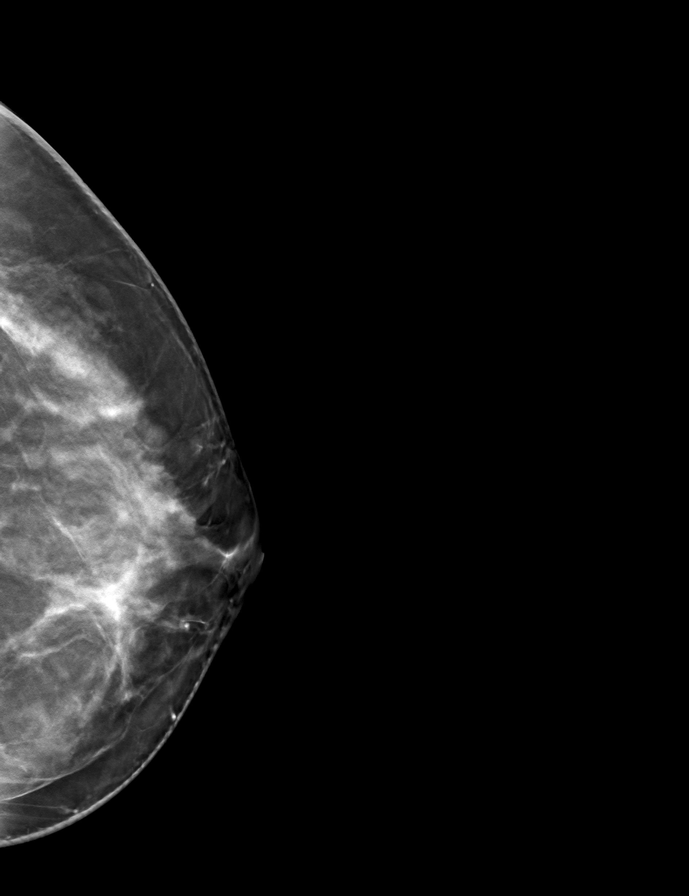

[9 of 24 positions shown; findings below may reference images not displayed]

ACR Breast Density Category c: The breast tissue is heterogeneously
dense, which may obscure small masses.
FINDINGS: There are no findings suspicious for malignancy.
IMPRESSION: No mammographic evidence of malignancy. A result letter of this
screening mammogram will be mailed directly to the patient.

RECOMMENDATION:
Screening mammogram in one year. (Code:Q3-W-BC3)

BI-RADS CATEGORY  1: Negative.

## 2022-01-04 ENCOUNTER — Ambulatory Visit: Payer: Self-pay | Admitting: Internal Medicine

## 2022-01-04 ENCOUNTER — Other Ambulatory Visit: Payer: Self-pay | Admitting: Internal Medicine

## 2022-01-04 ENCOUNTER — Encounter: Payer: Self-pay | Admitting: Internal Medicine

## 2022-01-04 VITALS — BP 134/92 | HR 80 | Resp 12 | Ht 62.75 in | Wt 154.0 lb

## 2022-01-04 DIAGNOSIS — H547 Unspecified visual loss: Secondary | ICD-10-CM

## 2022-01-04 DIAGNOSIS — Z1231 Encounter for screening mammogram for malignant neoplasm of breast: Secondary | ICD-10-CM

## 2022-01-04 DIAGNOSIS — Z0000 Encounter for general adult medical examination without abnormal findings: Secondary | ICD-10-CM

## 2022-01-04 DIAGNOSIS — E781 Pure hyperglyceridemia: Secondary | ICD-10-CM

## 2022-01-04 DIAGNOSIS — R87619 Unspecified abnormal cytological findings in specimens from cervix uteri: Secondary | ICD-10-CM

## 2022-01-04 NOTE — Progress Notes (Signed)
 Subjective:    Patient ID: [name], female   DOB: 1976/03/26, 46 y.o.   MRN: [id number]   HPI  CPE with pap  1.  Pap:  Atypical glandular cells of uncertain significance.  Endometrial cells were present.  Unable to ascertain if was menstruating at the time.  Was sent to [facility], but never appointed.  No HPV performed.    2.  Mammogram:  Last was 03/04/2021 and normal.  No family history of breast cancer.    3.  Osteoprevention:  Is drinking milk and eating cheese regularly--3 times daily.  Walks 4 times weekly for 1 hour.     4.  Guaiac Cards/FIT:  negative coards x3 on 01/15/21.    5.  Colonoscopy:  Never.  No family history of colon cancer.    6.  Immunizations:  Did not obtain influenza vaccine.  Has not had more then her 2 initial COVID vaccines.  Was not interested in booster this time last year.  Still not interested. Immunization History  Administered Date(s) Administered   Influenza,inj,Quad PF,6+ Mos 06/11/2013   Moderna Sars-Covid-2 Vaccination 12/23/2019, 11/24/2020   Td 12/31/2020   Tdap 03/17/2011     7.  Glucose/Cholesterol:  Blood glucose has always been fine.  Her cholesterol last June was fine save for elevated triglycerides.  Lipid Panel     Component Value Date/Time   CHOL 163 12/31/2020 0911   TRIG 156 (H) 12/31/2020 0911   HDL 50 12/31/2020 0911   CHOLHDL 3.4 09/24/2014 0924   VLDL 32 09/24/2014 0924   LDLCALC 86 12/31/2020 0911   LABVLDL 27 12/31/2020 0911     Current Meds  Medication Sig   acetaminophen  (TYLENOL ) 500 MG tablet Take 500 mg by mouth every 6 (six) hours as needed.   ibuprofen  (ADVIL ,MOTRIN ) 200 MG tablet Take 200 mg by mouth every 6 (six) hours as needed.   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   Wears glasses     Past  Surgical History:  Procedure Laterality Date   CHOLECYSTECTOMY N/A 07/16/2014   Procedure: LAPAROSCOPIC CHOLECYSTECTOMY WITH INTRAOPERATIVE CHOLANGIOGRAM;  Surgeon: Debby Shipper, MD;  Location: [facility];  Service: General;  Laterality: N/A;   Family History  Problem Relation Age of Onset   Hypertension Mother    Arthritis Mother    Hypertension Father    Hypertension Sister    Breast cancer Neg Hx    Family Status  Relation Name Status   Mother  Alive, age 75y   Father  Alive, age 56y   Sister  Alive, age 110y   Sister  Alive   Sister  Alive   Brother  Alive   Brother  Alive   Brother  Alive   Brother  Alive   Brother  Alive   Brother  Alive   Daughter Sherrilyn Searles, age [place]   Son Dorise Searles, age 23y   Son Lorence Searles, age 13y   Son Franky Searles, age 8y   Neg Hx  (Not Specified)    Social History   Socioeconomic History   Marital status: Married    Spouse name: Yony   Number of children: 4   Years of education: 16   Highest education level: Not on file  Occupational  History   Occupation: Catering manager company    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 In [place]   Lives with her husband and 4 children.   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[place]   Social Determinants of Health   Financial Resource Strain: Low Risk    Difficulty of Paying Living Expenses: Not hard at all  Food Insecurity: No Food Insecurity   Worried About Programme researcher, broadcasting/film/video in the Last Year: Never true   Barista in the Last Year: Never true  Transportation Needs: No  Transportation Needs   Lack of Transportation (Medical): No   Lack of Transportation (Non-Medical): No  Physical Activity: Not on file  Stress: Not on file  Social Connections: Not on file  Intimate Partner Violence: Not At Risk   Fear of Current or Ex-Partner: No   Emotionally Abused: No   Physically Abused: No   Sexually Abused: No      Review of Systems  HENT:  Negative for dental problem (Has been going to Dental Clinic--May 2023 had a cleaning and had a good check.).   Eyes:  Negative for visual disturbance (Uses reading glasses.  Would like to have eye check).  Respiratory:  Negative for shortness of breath.   Cardiovascular:  Negative for chest pain, palpitations and leg swelling.  Gastrointestinal:  Negative for abdominal pain and blood in stool (No melena).  Genitourinary:  Negative for dysuria, menstrual problem and vaginal discharge.  Musculoskeletal:  Negative for arthralgias.  Neurological:  Negative for weakness, light-headedness and numbness.  Psychiatric/Behavioral:  Negative for dysphoric mood. The patient is not nervous/anxious.      Objective:   BP (!) 136/92 (BP Location: Left Arm, Patient Position: Sitting, Cuff Size: Normal)   Pulse 80   Resp 12   Ht 5' 2.75 (1.594 m)   Wt 154 lb (69.9 kg)   LMP 12/07/2021 (Exact Date)   BMI 27.50 kg/m   Physical Exam HENT:     Head: Normocephalic and atraumatic.     Right Ear: Tympanic membrane, ear canal and external ear normal.     Left Ear: Tympanic membrane, ear canal and external ear normal.     Nose: Nose normal.     Mouth/Throat:     Mouth: Mucous membranes are moist.     Pharynx: Oropharynx is clear.  Eyes:     Extraocular Movements: Extraocular movements intact.     Conjunctiva/sclera: Conjunctivae normal.     Pupils: Pupils are equal, round, and reactive to light.     Comments: Discs sharp  Neck:     Thyroid: No thyroid mass or thyromegaly.  Cardiovascular:     Rate and Rhythm: Normal rate and  regular rhythm.     Heart sounds: S1 normal and S2 normal. No murmur heard.    No friction rub. No S3 or S4 sounds.     Comments: No carotid bruits.  Carotid, radial, femoral, DP and PT pulses normal and equal.  Pulmonary:     Effort: Pulmonary effort is normal.     Breath sounds: Normal breath sounds and air entry.  Chest:  Breasts:    Right: No inverted nipple, mass or nipple discharge.     Left: No inverted nipple, mass or nipple discharge.  Abdominal:     General: Bowel sounds are normal.     Palpations: Abdomen is soft. There is no hepatomegaly, splenomegaly or mass.     Tenderness: There is no abdominal tenderness.     Hernia: No hernia is present.  Genitourinary:    Comments: Normal external female genitalia Cervix and vaginal mucosa without lesion No uterine or adnexal mass or tenderness. Musculoskeletal:        General: Normal range of motion.     Cervical back: Normal range of motion and neck supple.     Right lower leg: No edema.     Left lower leg: No edema.  Lymphadenopathy:     Head:     Right side of head: No submental or submandibular adenopathy.     Left side of head: No submental or submandibular adenopathy.     Cervical: No cervical adenopathy.     Upper Body:     Right upper body: No supraclavicular or axillary adenopathy.     Left upper body: No supraclavicular or axillary adenopathy.     Lower Body: No right inguinal adenopathy. No left inguinal adenopathy.  Skin:    General: Skin is warm.     Capillary Refill: Capillary refill takes less than 2 seconds.     Findings: No rash.  Neurological:     General: No focal deficit present.     Mental Status: She is alert and oriented to person, place, and time.     Cranial Nerves: Cranial nerves 2-12 are intact.     Sensory: Sensation is intact.     Motor: Motor function is intact.     Coordination: Coordination is intact.     Gait: Gait is intact.     Deep Tendon Reflexes: Reflexes are normal and  symmetric.  Psychiatric:        Mood and Affect: Mood normal.        Speech: Speech normal.        Behavior: Behavior normal. Behavior is cooperative.      Assessment & Plan    CPE with pap Mammogram FIT to return in 2 weeks. Encouraged influenza and COVID vaccines in fall.  2.  History of atypical glandular cells of unknown significance last year.  Did not get appointed with gynecology as ordered.  Sending again for opinion.  3..  Elevated BP:  nurse visit for BP recheck and return of FIT in 2 weeks.  4.  Mildly elevated triglycerides:  work on diet and physical activity.

## 2022-01-05 LAB — LIPID PANEL W/O CHOL/HDL RATIO
Cholesterol, Total: 149 mg/dL (ref 100–199)
HDL: 39 mg/dL — ABNORMAL LOW (ref >39)
LDL Chol Calc (NIH): 80 mg/dL (ref 0–99)
Triglycerides: 175 mg/dL — ABNORMAL HIGH (ref 0–149)
VLDL Cholesterol Cal: 30 mg/dL (ref 5–40)

## 2022-01-05 LAB — COMPREHENSIVE METABOLIC PANEL
ALT: 35 IU/L — ABNORMAL HIGH (ref 0–32)
AST: 24 IU/L (ref 0–40)
Albumin/Globulin Ratio: 1.8 (ref 1.2–2.2)
Albumin: 4.6 g/dL (ref 3.8–4.8)
Alkaline Phosphatase: 70 IU/L (ref 44–121)
BUN/Creatinine Ratio: 12 (ref 9–23)
BUN: 8 mg/dL (ref 6–24)
Bilirubin Total: 0.6 mg/dL (ref 0.0–1.2)
CO2: 23 mmol/L (ref 20–29)
Calcium: 9.3 mg/dL (ref 8.7–10.2)
Chloride: 102 mmol/L (ref 96–106)
Creatinine, Ser: 0.66 mg/dL (ref 0.57–1.00)
Globulin, Total: 2.6 g/dL (ref 1.5–4.5)
Glucose: 96 mg/dL (ref 70–99)
Potassium: 4.2 mmol/L (ref 3.5–5.2)
Sodium: 137 mmol/L (ref 134–144)
Total Protein: 7.2 g/dL (ref 6.0–8.5)
eGFR: 109 mL/min/{1.73_m2} (ref >59)

## 2022-01-05 LAB — CBC WITH DIFFERENTIAL/PLATELET
Basophils Absolute: 0.1 10*3/uL (ref 0.0–0.2)
Basos: 1 %
EOS (ABSOLUTE): 0.1 10*3/uL (ref 0.0–0.4)
Eos: 1 %
Hematocrit: 41.8 % (ref 34.0–46.6)
Hemoglobin: 13.5 g/dL (ref 11.1–15.9)
Immature Grans (Abs): 0 10*3/uL (ref 0.0–0.1)
Immature Granulocytes: 0 %
Lymphocytes Absolute: 3.4 10*3/uL — ABNORMAL HIGH (ref 0.7–3.1)
Lymphs: 33 %
MCH: 25.7 pg — ABNORMAL LOW (ref 26.6–33.0)
MCHC: 32.3 g/dL (ref 31.5–35.7)
MCV: 80 fL (ref 79–97)
Monocytes Absolute: 0.5 10*3/uL (ref 0.1–0.9)
Monocytes: 5 %
Neutrophils Absolute: 6.2 10*3/uL (ref 1.4–7.0)
Neutrophils: 60 %
Platelets: 348 10*3/uL (ref 150–450)
RBC: 5.25 x10E6/uL (ref 3.77–5.28)
RDW: 13.1 % (ref 11.7–15.4)
WBC: 10.3 10*3/uL (ref 3.4–10.8)

## 2022-01-05 LAB — CYTOLOGY - PAP

## 2022-01-18 ENCOUNTER — Ambulatory Visit: Payer: Self-pay

## 2022-01-18 VITALS — BP 150/96 | HR 80

## 2022-01-18 DIAGNOSIS — Z1211 Encounter for screening for malignant neoplasm of colon: Secondary | ICD-10-CM

## 2022-01-18 DIAGNOSIS — Z0130 Encounter for examination of blood pressure without abnormal findings: Secondary | ICD-10-CM

## 2022-01-20 NOTE — Progress Notes (Unsigned)
Start Amlodipine 5 mg daily and return in 2 weeks for bp check.

## 2022-01-21 LAB — POC FIT TEST STOOL: Fecal Occult Blood: NEGATIVE

## 2022-01-21 MED ORDER — AMLODIPINE BESYLATE 5 MG PO TABS: 5.0000 mg | ORAL_TABLET | Freq: Every day | ORAL | 11 refills | Status: DC

## 2022-02-11 ENCOUNTER — Ambulatory Visit: Payer: Self-pay

## 2022-02-11 VITALS — BP 132/82 | HR 80

## 2022-02-11 DIAGNOSIS — Z0130 Encounter for examination of blood pressure without abnormal findings: Secondary | ICD-10-CM

## 2022-02-11 NOTE — Progress Notes (Signed)
Patient has been taking bp medication consistently.  After reporting bp to Dr Delrae Alfred, there will be no changes to medication.

## 2022-03-02 ENCOUNTER — Ambulatory Visit (INDEPENDENT_AMBULATORY_CARE_PROVIDER_SITE_OTHER): Payer: Self-pay | Admitting: Family Medicine

## 2022-03-02 ENCOUNTER — Other Ambulatory Visit: Payer: Self-pay

## 2022-03-02 ENCOUNTER — Encounter: Payer: Self-pay | Admitting: Family Medicine

## 2022-03-02 VITALS — BP 142/81 | HR 79 | Wt 150.0 lb

## 2022-03-02 DIAGNOSIS — R87619 Unspecified abnormal cytological findings in specimens from cervix uteri: Secondary | ICD-10-CM

## 2022-03-02 NOTE — Progress Notes (Signed)
Patient was referred for a history of AGUS pap.  Review of the record showed that in 2022 she had AGUS but then 2023 pap was NIL. She reports in 2022 she was on her menses.  She was referred this year after her normal pap smear. She denies abdominal pain, bloating, early satiety or irregular bleeding.   We discussed the option of applying for financial aid and then scheduling colposcopy and endometrial biopsy. This is reasonable given her interval pap was NIL.  Patient is appreciative of this approach.   Once she hear about financial coverage she will call [facility]. Please try to put with Dr Alvester Morin and OK to [facility]   Federico Flake, MD, MPH, ABFM, [facility] for [facility]

## 2022-03-10 ENCOUNTER — Ambulatory Visit
Admission: RE | Admit: 2022-03-10 | Discharge: 2022-03-10 | Disposition: A | Discharge status: Home / Self Care | Payer: No Typology Code available for payment source | Source: Ambulatory Visit | Attending: Internal Medicine | Admitting: Internal Medicine

## 2022-03-10 DIAGNOSIS — Z1231 Encounter for screening mammogram for malignant neoplasm of breast: Secondary | ICD-10-CM

## 2023-01-12 ENCOUNTER — Other Ambulatory Visit: Payer: Self-pay | Admitting: Internal Medicine

## 2023-01-16 ENCOUNTER — Other Ambulatory Visit: Payer: Self-pay | Admitting: Internal Medicine

## 2023-01-23 ENCOUNTER — Encounter: Payer: Self-pay | Admitting: Internal Medicine

## 2023-02-13 ENCOUNTER — Other Ambulatory Visit: Payer: Self-pay

## 2023-02-13 MED ORDER — AMLODIPINE BESYLATE 5 MG PO TABS: 5.0000 mg | ORAL_TABLET | Freq: Every day | ORAL | 2 refills | Status: DC

## 2023-04-24 ENCOUNTER — Ambulatory Visit: Payer: Self-pay | Admitting: Internal Medicine

## 2023-04-24 ENCOUNTER — Encounter: Payer: Self-pay | Admitting: Internal Medicine

## 2023-04-24 VITALS — BP 134/80 | HR 92 | Resp 12 | Ht 62.75 in | Wt 152.0 lb

## 2023-04-24 DIAGNOSIS — I10 Essential (primary) hypertension: Secondary | ICD-10-CM

## 2023-04-24 DIAGNOSIS — Z0000 Encounter for general adult medical examination without abnormal findings: Secondary | ICD-10-CM

## 2023-04-24 DIAGNOSIS — Z1231 Encounter for screening mammogram for malignant neoplasm of breast: Secondary | ICD-10-CM

## 2023-04-24 NOTE — Progress Notes (Signed)
Subjective:    Patient ID: [name], female   DOB: Dec 22, 1975, 47 y.o.   MRN: [id number]   HPI  Tereasa Coop interprets  CPE without pap  1.  Pap:  normal 12/2021.    2.  Mammogram:  Last 03/2022 and normal.  No family history of breast cancer.    3.  Osteoprevention:  Drinks 3 + cups of milk daily.  3-4 times weekly walks for 1 hour.    4.  Guaiac Cards/FIT:  Last 12/2021 and negative for blood.  5.  Colonoscopy:  Never.  No family history of colon cancer.    6.  Immunizations:  Has not had an influenza vaccine for many years.  She feels it caused her to be ill for 3-4 weeks after her last influenza vaccine.  Discussed cannot cause the actual illness.  Discussed COVID vaccination and recommendations to obtain new booster. She remains uncommitted. Immunization History  Administered Date(s) Administered   Influenza,inj,Quad PF,6+ Mos 06/11/2013   Moderna Sars-Covid-2 Vaccination 12/23/2019, 11/24/2020   Td 12/31/2020   Tdap 03/17/2011     7.  Glucose/Cholesterol:  Dyslipidemia.  Blood glucose in past has been fine.  8.  Hypertension:  tolerating amlodipine fine.    Current Meds  Medication Sig   acetaminophen (TYLENOL) 500 MG tablet Take 500 mg by mouth every 6 (six) hours as needed.   amLODipine (NORVASC) 5 MG tablet Take 1 tablet (5 mg total) by mouth daily.   ibuprofen (ADVIL,MOTRIN) 200 MG tablet Take 200 mg by mouth every 6 (six) hours as needed.   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   Wears glasses    Past Surgical History:  Procedure Laterality Date   CHOLECYSTECTOMY N/A 07/16/2014   Procedure: LAPAROSCOPIC CHOLECYSTECTOMY WITH INTRAOPERATIVE CHOLANGIOGRAM;  Surgeon: Harriette Bouillon, MD;  Location: [facility];  Service: General;  Laterality: N/A;   Family  History  Problem Relation Age of Onset   Hypertension Mother    Arthritis Mother    Hypertension Father    Hypertension Sister    Breast cancer Neg Hx    Social History   Socioeconomic History   Marital status: Married    Spouse name: Yony   Number of children: 4   Years of education: 16   Highest education level: Not on file  Occupational History   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 In [place]   Lives with her husband and 4 children.   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[place]   Social Determinants of Health   Financial Resource Strain: Low Risk  (01/04/2022)   Overall Financial Resource Strain (CARDIA)    Difficulty of Paying Living Expenses: Not hard at all  Food Insecurity:  No Food Insecurity (04/24/2023)   Hunger Vital Sign    Worried About Running Out of Food in the Last Year: Never true    Ran Out of Food in the Last Year: Never true  Transportation Needs: No Transportation Needs (04/24/2023)   PRAPARE - Administrator, Civil Service (Medical): No    Lack of Transportation (Non-Medical): No  Physical Activity: Insufficiently Active (09/05/2017)   Exercise Vital Sign    Days of Exercise per Week: 3 days    Minutes of Exercise per Session: 40 min  Stress: No Stress Concern Present (09/05/2017)   [employer] of Occupational Health - Occupational Stress Questionnaire    Feeling of Stress : Only a little  Social Connections: Moderately Integrated (09/05/2017)   Social Connection and Isolation Panel [NHANES]     Frequency of Communication with Friends and Family: More than three times a week    Frequency of Social Gatherings with Friends and Family: More than three times a week    Attends Religious Services: More than 4 times per year    Active Member of [employer] or Organizations: No    Attends Banker Meetings: Never    Marital Status: Married  Catering manager Violence: Not At Risk (04/24/2023)   Humiliation, Afraid, Rape, and Kick questionnaire    Fear of Current or Ex-Partner: No    Emotionally Abused: No    Physically Abused: No    Sexually Abused: No      Review of Systems  HENT:  Negative for dental problem (Had her appt last month with dental clinic.  No problems.).   Eyes:  Negative for visual disturbance (Reading glasses only.).  Respiratory:  Negative for shortness of breath.   Cardiovascular:  Negative for chest pain, palpitations and leg swelling.  Gastrointestinal:  Negative for abdominal pain and blood in stool (No melena.).  Psychiatric/Behavioral:  Negative for dysphoric mood. The patient is not nervous/anxious.       Objective:   BP 134/80 (BP Location: Right Arm, Patient Position: Sitting, Cuff Size: Normal)   Pulse 92   Resp 12   Ht 5' 2.75" (1.594 m)   Wt 152 lb (68.9 kg)   LMP 03/29/2023 (Exact Date)   BMI 27.14 kg/m   Physical Exam HENT:     Head: Normocephalic and atraumatic.     Right Ear: Tympanic membrane, ear canal and external ear normal.     Left Ear: Tympanic membrane, ear canal and external ear normal.     Nose: Nose normal.     Mouth/Throat:     Mouth: Mucous membranes are moist.     Pharynx: Oropharynx is clear.  Eyes:     Extraocular Movements: Extraocular movements intact.     Conjunctiva/sclera: Conjunctivae normal.     Pupils: Pupils are equal, round, and reactive to light.     Comments: Discs sharp  Neck:     Thyroid: No thyroid mass or thyromegaly.  Cardiovascular:     Rate and Rhythm: Normal rate and regular rhythm.      Heart sounds: S1 normal and S2 normal. No murmur heard.    No friction rub. No S3 or S4 sounds.     Comments: No carotid bruits.  Carotid, radial, femoral, DP and PT pulses normal and equal.   Pulmonary:     Effort: Pulmonary effort is normal.     Breath sounds: Normal breath sounds and air entry.  Chest:  Breasts:    Right: No  inverted nipple, mass or nipple discharge.     Left: No inverted nipple, mass or nipple discharge.  Abdominal:     General: Abdomen is flat. Bowel sounds are normal.     Palpations: Abdomen is soft. There is no hepatomegaly, splenomegaly or mass.     Tenderness: There is no abdominal tenderness.     Hernia: No hernia is present.  Genitourinary:    General: Normal vulva.     Comments: No uterine or adnexal mass or tenderness Uterus retroverted. Musculoskeletal:        General: Normal range of motion.     Cervical back: Normal range of motion and neck supple.     Right lower leg: No edema.     Left lower leg: No edema.  Feet:     Right foot:     Skin integrity: Skin integrity normal.     Toenail Condition: Right toenails are normal.     Left foot:     Skin integrity: Skin integrity normal.     Toenail Condition: Left toenails are normal.  Lymphadenopathy:     Head:     Right side of head: No submental or submandibular adenopathy.     Left side of head: No submental or submandibular adenopathy.     Cervical: No cervical adenopathy.     Upper Body:     Right upper body: No supraclavicular or axillary adenopathy.     Left upper body: No supraclavicular or axillary adenopathy.     Lower Body: No right inguinal adenopathy. No left inguinal adenopathy.  Skin:    General: Skin is warm.     Findings: Lesion (Multiple dark brown well circumscribed nevi on back and right leg) present. No rash.  Neurological:     General: No focal deficit present.     Mental Status: She is alert and oriented to person, place, and time.     Cranial Nerves: Cranial nerves 2-12  are intact.     Sensory: Sensation is intact.     Motor: Motor function is intact.     Coordination: Coordination is intact.     Gait: Gait is intact.     Deep Tendon Reflexes: Reflexes are normal and symmetric.  Psychiatric:        Speech: Speech normal.        Behavior: Behavior normal. Behavior is cooperative.      Assessment & Plan   CPE without pap Mammogram ordered Refused influenza vaccination Encouraged COVID booster. FIT to return in 2 weeks Return for fasting labs:  FLP, CBC, CMP  2.  Hypertension:  controlled.

## 2023-05-04 ENCOUNTER — Other Ambulatory Visit (INDEPENDENT_AMBULATORY_CARE_PROVIDER_SITE_OTHER): Payer: Self-pay

## 2023-05-04 DIAGNOSIS — Z1211 Encounter for screening for malignant neoplasm of colon: Secondary | ICD-10-CM

## 2023-05-04 LAB — POC FIT TEST STOOL: Fecal Occult Blood: NEGATIVE

## 2023-05-11 ENCOUNTER — Other Ambulatory Visit: Payer: Self-pay

## 2023-05-11 DIAGNOSIS — Z0000 Encounter for general adult medical examination without abnormal findings: Secondary | ICD-10-CM

## 2023-05-12 ENCOUNTER — Other Ambulatory Visit: Payer: Self-pay | Admitting: Internal Medicine

## 2023-05-12 LAB — COMPREHENSIVE METABOLIC PANEL
ALT: 15 [IU]/L (ref 0–32)
AST: 25 [IU]/L (ref 0–40)
Albumin: 4.4 g/dL (ref 3.9–4.9)
Alkaline Phosphatase: 84 [IU]/L (ref 44–121)
BUN/Creatinine Ratio: 11 (ref 9–23)
BUN: 8 mg/dL (ref 6–24)
Bilirubin Total: 0.8 mg/dL (ref 0.0–1.2)
CO2: 22 mmol/L (ref 20–29)
Calcium: 9.1 mg/dL (ref 8.7–10.2)
Chloride: 105 mmol/L (ref 96–106)
Creatinine, Ser: 0.72 mg/dL (ref 0.57–1.00)
Globulin, Total: 2.2 g/dL (ref 1.5–4.5)
Glucose: 93 mg/dL (ref 70–99)
Potassium: 4.1 mmol/L (ref 3.5–5.2)
Sodium: 141 mmol/L (ref 134–144)
Total Protein: 6.6 g/dL (ref 6.0–8.5)
eGFR: 103 mL/min/{1.73_m2} (ref >59)

## 2023-05-12 LAB — CBC WITH DIFFERENTIAL/PLATELET
Basophils Absolute: 0 10*3/uL (ref 0.0–0.2)
Basos: 0 %
EOS (ABSOLUTE): 0.1 10*3/uL (ref 0.0–0.4)
Eos: 1 %
Hematocrit: 40.8 % (ref 34.0–46.6)
Hemoglobin: 13.2 g/dL (ref 11.1–15.9)
Immature Grans (Abs): 0 10*3/uL (ref 0.0–0.1)
Immature Granulocytes: 0 %
Lymphocytes Absolute: 2.9 10*3/uL (ref 0.7–3.1)
Lymphs: 30 %
MCH: 26.2 pg — ABNORMAL LOW (ref 26.6–33.0)
MCHC: 32.4 g/dL (ref 31.5–35.7)
MCV: 81 fL (ref 79–97)
Monocytes Absolute: 0.4 10*3/uL (ref 0.1–0.9)
Monocytes: 4 %
Neutrophils Absolute: 6.5 10*3/uL (ref 1.4–7.0)
Neutrophils: 65 %
Platelets: 357 10*3/uL (ref 150–450)
RBC: 5.04 x10E6/uL (ref 3.77–5.28)
RDW: 13.4 % (ref 11.7–15.4)
WBC: 9.9 10*3/uL (ref 3.4–10.8)

## 2023-05-12 LAB — LIPID PANEL W/O CHOL/HDL RATIO
Cholesterol, Total: 130 mg/dL (ref 100–199)
HDL: 47 mg/dL (ref >39)
LDL Chol Calc (NIH): 67 mg/dL (ref 0–99)
Triglycerides: 81 mg/dL (ref 0–149)
VLDL Cholesterol Cal: 16 mg/dL (ref 5–40)

## 2023-06-20 ENCOUNTER — Ambulatory Visit
Admission: RE | Admit: 2023-06-20 | Discharge: 2023-06-20 | Disposition: A | Discharge status: Home / Self Care | Payer: No Typology Code available for payment source | Source: Ambulatory Visit | Attending: Internal Medicine | Admitting: Internal Medicine

## 2023-06-20 DIAGNOSIS — Z1231 Encounter for screening mammogram for malignant neoplasm of breast: Secondary | ICD-10-CM

## 2023-10-25 ENCOUNTER — Encounter: Payer: Self-pay | Admitting: Internal Medicine

## 2023-10-25 ENCOUNTER — Ambulatory Visit: Payer: Self-pay | Admitting: Internal Medicine

## 2023-10-25 VITALS — BP 136/88 | HR 89 | Resp 16 | Ht 62.75 in | Wt 155.0 lb

## 2023-10-25 DIAGNOSIS — F439 Reaction to severe stress, unspecified: Secondary | ICD-10-CM

## 2023-10-25 DIAGNOSIS — I10 Essential (primary) hypertension: Secondary | ICD-10-CM

## 2023-10-25 NOTE — Progress Notes (Signed)
    Subjective:    Patient ID: [name], female   DOB: [birth year], 48 y.o.   MRN: [id number]   HPI  Blane Stallion interprets   Hypertension:  initially up, but after resting, acceptable.    2.  Husband, Yony, with new chronic health diagnosis and she is concerned he will not change his lifestyle.  She feels stressed.    3.  HM:  has not had COVID nor influenza vaccines this year or for many years.    Current Meds  Medication Sig   acetaminophen  (TYLENOL ) 500 MG tablet Take 500 mg by mouth every 6 (six) hours as needed.   amLODipine  (NORVASC ) 5 MG tablet TAKE 1 TABLET(5 MG) BY MOUTH DAILY   ibuprofen  (ADVIL ,MOTRIN ) 200 MG tablet Take 200 mg by mouth every 6 (six) hours as needed.   No Known Allergies   Review of Systems    Objective:   BP 136/88 (BP Location: Left Arm, Patient Position: Sitting, Cuff Size: Normal)   Pulse 89   Resp 16   Ht 5' 2.75 (1.594 m)   Wt 155 lb (70.3 kg)   LMP 10/14/2023 (Exact Date)   BMI 27.68 kg/m   Physical Exam HENT:     Head: Normocephalic and atraumatic.  Eyes:     Extraocular Movements: Extraocular movements intact.     Conjunctiva/sclera: Conjunctivae normal.     Pupils: Pupils are equal, round, and reactive to light.  Cardiovascular:     Rate and Rhythm: Normal rate and regular rhythm.     Pulses: Normal pulses.     Heart sounds: Normal heart sounds. No murmur heard.    No friction rub.  Pulmonary:     Effort: Pulmonary effort is normal.     Breath sounds: Normal breath sounds.  Neurological:     Mental Status: She is alert.      Assessment & Plan  Hypertension:  acceptable on recheck.  Encouraged working on diet and physical activity to decrease weight and BP.    2.  Stress about husband's health:  referral to [facility], [facility] for counseling.  3.  HM:  declines recommended vaccines.

## 2023-12-03 ENCOUNTER — Other Ambulatory Visit: Payer: Self-pay | Admitting: Internal Medicine

## 2023-12-06 ENCOUNTER — Other Ambulatory Visit: Payer: Self-pay

## 2023-12-06 MED ORDER — AMLODIPINE BESYLATE 5 MG PO TABS: 5.0000 mg | ORAL_TABLET | Freq: Every day | ORAL | 11 refills | Status: AC

## 2024-02-14 ENCOUNTER — Telehealth: Payer: Self-pay | Admitting: Psychology

## 2024-04-23 ENCOUNTER — Encounter: Payer: Self-pay | Admitting: Internal Medicine

## 2024-04-23 ENCOUNTER — Ambulatory Visit: Payer: Self-pay | Admitting: Internal Medicine

## 2024-04-23 VITALS — BP 128/76 | HR 76 | Resp 18 | Ht 63.0 in | Wt 148.0 lb

## 2024-04-23 DIAGNOSIS — Z1159 Encounter for screening for other viral diseases: Secondary | ICD-10-CM

## 2024-04-23 DIAGNOSIS — Z1231 Encounter for screening mammogram for malignant neoplasm of breast: Secondary | ICD-10-CM

## 2024-04-23 DIAGNOSIS — R87619 Unspecified abnormal cytological findings in specimens from cervix uteri: Secondary | ICD-10-CM

## 2024-04-23 DIAGNOSIS — Z0000 Encounter for general adult medical examination without abnormal findings: Secondary | ICD-10-CM

## 2024-04-23 DIAGNOSIS — H547 Unspecified visual loss: Secondary | ICD-10-CM

## 2024-04-23 NOTE — Progress Notes (Signed)
 Subjective:    Patient ID: Davied FORBES Encarnacion everitt Georgena, female   DOB: [birth year], 48 y.o.   MRN: [id number]   HPI  CPE without pap  1.  Pap:  Last performed in 2023 and normal.  She did have atypical glandular cells with pap in 2022.  Was subsequently seen by gynecology 03/2022 who were planning  colposcopy with endometrial biopsy once she obtained financial assistance, but she never followed up.  She states she tried to get the assistance, but did not obtain--not clear why.  She could not recall in 2022 if she was having a period around the time of the pap.  2.  Mammogram:  Last 06/2023 and normal.  She has received a letter.  No family history of breast cancer.    3.  Osteoprevention:  Drinks 2% milk, 2 servings and 1 serving each of yogurt and cheese daily.  Walks one hour 3-4 times weekly.    4.  Guaiac Cards/FIT:  negative yearly, last 2024.    5.  Colonoscopy:  Never.  No family history of colon cancer.    6.  Immunizations:  States she obtained the influenza vaccine at [facility] in August.   Immunization History  Administered Date(s) Administered   Influenza,inj,Quad PF,6+ Mos 06/11/2013   Moderna Sars-Covid-2 Vaccination 11/25/2019, 12/23/2019, 11/24/2020   Td 12/31/2020   Tdap 03/17/2011     7.  Glucose/Cholesterol:  Glucose and cholesterol have been excellent in past.   Lipid Panel     Component Value Date/Time   CHOL 130 05/11/2023 0920   TRIG 81 05/11/2023 0920   HDL 47 05/11/2023 0920   CHOLHDL 3.4 09/24/2014 0924   VLDL 32 09/24/2014 0924   LDLCALC 67 05/11/2023 0920   LABVLDL 16 05/11/2023 0920     Current Meds  Medication Sig   acetaminophen  (TYLENOL ) 500 MG tablet Take 500 mg by mouth every 6 (six) hours as needed.   amLODipine  (NORVASC ) 5 MG tablet Take 1 tablet (5 mg total) by mouth daily.   ibuprofen  (ADVIL ,MOTRIN ) 200 MG tablet Take 200 mg by mouth every 6 (six) hours as needed.   No Known Allergies  Past Medical History:  Diagnosis Date    Atypical glandular cells on cervical Pap smear 12/2020   Follow up pap normal.  was to apply for financial assistance and have endometrial biopsy with gyn, but did not obtain nor follow up.   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   Wears glasses    Past Surgical History:  Procedure Laterality Date   CHOLECYSTECTOMY N/A 07/16/2014   Procedure: LAPAROSCOPIC CHOLECYSTECTOMY WITH INTRAOPERATIVE CHOLANGIOGRAM;  Surgeon: Debby Shipper, MD;  Location: [facility];  Service: General;  Laterality: N/A;   Family History  Problem Relation Age of Onset   Hypertension Mother    Arthritis Mother    Hypertension Father    Hypertension Sister    Breast cancer Neg Hx    Social History   Socioeconomic History   Marital status: Married    Spouse name: Yony   Number of children: 4   Years of education: 16   Highest education level: Not on file  Occupational History   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[place]   Social Drivers of Health   Financial Resource Strain: Low Risk  (04/23/2024)   Overall Financial Resource Strain (CARDIA)    Difficulty of Paying Living Expenses: Not very hard  Food Insecurity: No Food Insecurity (04/23/2024)   Hunger Vital Sign    Worried About Running Out of Food in the Last Year: Never true    Ran Out of Food in the Last Year: Never true  Transportation Needs: No Transportation Needs  (04/23/2024)   PRAPARE - Administrator, Civil Service (Medical): No    Lack of Transportation (Non-Medical): No  Physical Activity: Insufficiently Active (09/05/2017)   Exercise Vital Sign    Days of Exercise per Week: 3 days    Minutes of Exercise per Session: 40 min  Stress: No Stress Concern Present (09/05/2017)   [employer] of Occupational Health - Occupational Stress Questionnaire    Feeling of Stress : Only a little  Social Connections: Moderately Integrated (09/05/2017)   Social Connection and Isolation Panel    Frequency of Communication with Friends and Family: More than three times a week    Frequency of Social Gatherings with Friends and Family: More than three times a week    Attends Religious Services: More than 4 times per year    Active Member of [employer] or Organizations: No    Attends Banker Meetings: Never    Marital Status: Married  Catering manager Violence: Not At Risk (04/23/2024)   Humiliation, Afraid, Rape, and Kick questionnaire    Fear of Current or Ex-Partner: No    Emotionally Abused: No    Physically Abused: No    Sexually Abused: No      Review of Systems  HENT:  Negative for dental problem (Does have regularly scheduled appts with dental clinic.).   Eyes:  Positive for visual disturbance (Vision blurry with small lettering.).  Respiratory:  Negative for shortness of breath.   Cardiovascular:  Negative for chest pain, palpitations and leg swelling.  Gastrointestinal:  Negative for abdominal pain and blood in stool (No melena).  Musculoskeletal:        If standing for long periods of time, she will have a lot of pain on the right plantar heel surface.  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  Problem for 3 months.   Has good cushion insert since pain started, which has helped a bit.   Bothers her more in the evening after sitting for a while after she has been on feet all day at work  Neurological:  Negative for weakness  and numbness.  Psychiatric/Behavioral:  The patient is nervous/anxious (Still a bit stressed with her husband's chronic health issues.).       Objective:   BP 128/76 (BP Location: Left Arm, Patient Position: Sitting, Cuff Size: Normal)   Pulse 76   Resp 18   Ht 5' 3 (1.6 m)   Wt 148 lb (67.1 kg)   BMI 26.22 kg/m   Physical Exam Constitutional:      Appearance: Normal appearance.  HENT:  Head: Normocephalic and atraumatic.     Right Ear: Tympanic membrane, ear canal and external ear normal.     Left Ear: Tympanic membrane, ear canal and external ear normal.     Nose: Nose normal.     Mouth/Throat:     Mouth: Mucous membranes are moist.     Pharynx: Oropharynx is clear.  Eyes:     Extraocular Movements: Extraocular movements intact.     Conjunctiva/sclera: Conjunctivae normal.     Pupils: Pupils are equal, round, and reactive to light.     Comments: Discs sharp  Neck:     Thyroid: No thyroid mass or thyromegaly.  Cardiovascular:     Rate and Rhythm: Normal rate and regular rhythm.     Heart sounds: S1 normal and S2 normal. No murmur heard.    No friction rub. No S3 or S4 sounds.     Comments: No carotid bruits.  Carotid, radial, femoral, DP and PT pulses normal and equal.   Pulmonary:     Effort: Pulmonary effort is normal.     Breath sounds: Normal breath sounds and air entry.  Chest:  Breasts:    Right: No inverted nipple, mass or nipple discharge.     Left: No inverted nipple, mass or nipple discharge.  Abdominal:     General: Abdomen is flat. Bowel sounds are normal.     Palpations: Abdomen is soft. There is no hepatomegaly, splenomegaly or mass.     Tenderness: There is no abdominal tenderness.     Hernia: No hernia is present.  Genitourinary:    General: Normal vulva.     Comments: No uterine or adnexal mass or tenderness.  Musculoskeletal:        General: Normal range of motion.     Cervical back: Normal range of motion and neck supple.     Right  lower leg: No edema.     Left lower leg: No edema.       Feet:  Feet:     Comments: Mild point tenderness, plantar aspect of right heel.  Lymphadenopathy:     Head:     Right side of head: No submental or submandibular adenopathy.     Left side of head: No submental or submandibular adenopathy.     Cervical: No cervical adenopathy.     Upper Body:     Right upper body: No supraclavicular or axillary adenopathy.     Left upper body: No supraclavicular or axillary adenopathy.     Lower Body: No right inguinal adenopathy. No left inguinal adenopathy.  Skin:    General: Skin is warm.     Capillary Refill: Capillary refill takes less than 2 seconds.     Findings: No rash.         Comments: Dark brown well circumscribed lesion at back of neck base and right lower leg.  Scant hairs from neck lesion.  Flat  Neurological:     General: No focal deficit present.     Mental Status: She is alert and oriented to person, place, and time.     Cranial Nerves: Cranial nerves 2-12 are intact.     Sensory: Sensation is intact.     Motor: Motor function is intact.     Coordination: Coordination is intact.     Gait: Gait is intact.     Deep Tendon Reflexes: Reflexes are normal and symmetric.  Psychiatric:        Mood and Affect: Mood normal.  Speech: Speech normal.        Behavior: Behavior normal. Behavior is cooperative.      Assessment & Plan   CPE without pap Unable to find influenza from [facility], but may be delayed from [facility]. Encouraged COVID vaccine as well. FIT to return in 2 weeks. CBC, CMP, FLP  2.  Right plantar fasciitis:  stretches, heel cup in all shoes.  No barefoot walking on hard floors.  Ibuprofen  regularly for the next 2 weeks.  Call if does not improve  3.  Abnormal glandular cells of unknown significance:  as this has been drawn out, will go ahead with a pelvic US  to evaluate endometrium for abnormalities and re refer to gyn if concern.

## 2024-04-23 NOTE — Patient Instructions (Signed)
 Ibuprofen  400 to 600 mg twice daily with meals for 2 weeks. Stretches twice daily Heel cups

## 2024-04-24 LAB — COMPREHENSIVE METABOLIC PANEL WITH GFR
ALT: 12 IU/L (ref 0–32)
AST: 18 IU/L (ref 0–40)
Albumin: 4.3 g/dL (ref 3.9–4.9)
Alkaline Phosphatase: 83 IU/L (ref 41–116)
BUN/Creatinine Ratio: 10 (ref 9–23)
BUN: 7 mg/dL (ref 6–24)
Bilirubin Total: 0.5 mg/dL (ref 0.0–1.2)
CO2: 20 mmol/L (ref 20–29)
Calcium: 8.8 mg/dL (ref 8.7–10.2)
Chloride: 105 mmol/L (ref 96–106)
Creatinine, Ser: 0.7 mg/dL (ref 0.57–1.00)
Globulin, Total: 2.4 g/dL (ref 1.5–4.5)
Glucose: 90 mg/dL (ref 70–99)
Potassium: 4.2 mmol/L (ref 3.5–5.2)
Sodium: 142 mmol/L (ref 134–144)
Total Protein: 6.7 g/dL (ref 6.0–8.5)
eGFR: 106 mL/min/1.73 (ref >59)

## 2024-04-24 LAB — CBC WITH DIFFERENTIAL/PLATELET
Basophils Absolute: 0.1 x10E3/uL (ref 0.0–0.2)
Basos: 1 %
EOS (ABSOLUTE): 0.1 x10E3/uL (ref 0.0–0.4)
Eos: 1 %
Hematocrit: 40.1 % (ref 34.0–46.6)
Hemoglobin: 12.9 g/dL (ref 11.1–15.9)
Immature Grans (Abs): 0 x10E3/uL (ref 0.0–0.1)
Immature Granulocytes: 0 %
Lymphocytes Absolute: 3.2 x10E3/uL — ABNORMAL HIGH (ref 0.7–3.1)
Lymphs: 36 %
MCH: 26.8 pg (ref 26.6–33.0)
MCHC: 32.2 g/dL (ref 31.5–35.7)
MCV: 83 fL (ref 79–97)
Monocytes Absolute: 0.5 x10E3/uL (ref 0.1–0.9)
Monocytes: 5 %
Neutrophils Absolute: 5.1 x10E3/uL (ref 1.4–7.0)
Neutrophils: 57 %
Platelets: 355 x10E3/uL (ref 150–450)
RBC: 4.82 x10E6/uL (ref 3.77–5.28)
RDW: 13.1 % (ref 11.7–15.4)
WBC: 9 x10E3/uL (ref 3.4–10.8)

## 2024-04-24 LAB — LIPID PANEL W/O CHOL/HDL RATIO
Cholesterol, Total: 137 mg/dL (ref 100–199)
HDL: 42 mg/dL (ref >39)
LDL Chol Calc (NIH): 73 mg/dL (ref 0–99)
Triglycerides: 120 mg/dL (ref 0–149)
VLDL Cholesterol Cal: 22 mg/dL (ref 5–40)

## 2024-04-24 LAB — HEPATITIS B CORE ANTIBODY, TOTAL: Hep B Core Total Ab: NEGATIVE

## 2024-04-24 LAB — HEPATITIS B SURFACE ANTIBODY,QUALITATIVE: Hep B Surface Ab, Qual: NONREACTIVE

## 2024-04-30 ENCOUNTER — Telehealth: Payer: Self-pay

## 2024-04-30 ENCOUNTER — Ambulatory Visit: Payer: Self-pay | Admitting: Internal Medicine

## 2024-04-30 NOTE — Telephone Encounter (Signed)
 Telephoned patient at mobile number using interpreter, Francee Sprung. Patient requested mammogram scholarship application. Patient's address confirmed. [facility]

## 2024-05-08 ENCOUNTER — Ambulatory Visit (INDEPENDENT_AMBULATORY_CARE_PROVIDER_SITE_OTHER): Payer: Self-pay

## 2024-05-08 DIAGNOSIS — Z23 Encounter for immunization: Secondary | ICD-10-CM

## 2024-05-27 ENCOUNTER — Telehealth: Payer: Self-pay

## 2024-05-27 NOTE — Telephone Encounter (Signed)
 Telephoned patient using interpreter#[phone]. No answer, no voice mail. [facility] (scholarship)

## 2024-06-20 ENCOUNTER — Ambulatory Visit: Payer: Self-pay

## 2024-06-20 ENCOUNTER — Telehealth: Payer: Self-pay | Admitting: Internal Medicine

## 2024-06-20 NOTE — Telephone Encounter (Signed)
 Patient called and states she had an appointment today to get mammogram , patient states she went to the address provided to the breast center and when she arrive there this morning she was asked for her insurance, patient notify front desk she did not have insurance but she had applied for a scholarship .   Patient states she was told that she could not be seen because she needed insurance.

## 2024-06-24 NOTE — Telephone Encounter (Signed)
 I called them to ask for an Email address, they said they can  help with the issue within a phone call.  Vina Buddle said they she called the patient on Oct 27 to schedule her for screening process at the breast center, but the patient did not answer  and did not make a return call. Therefore she was not on the list that day and they did not see her.  Vina ss going to check if she still has her scholarship and then she would call her again if not she will mail another application for the patient

## 2024-06-25 ENCOUNTER — Telehealth: Payer: Self-pay

## 2024-06-25 NOTE — Telephone Encounter (Signed)
 Patient telephoned, used interpreter#[id number]. I explained mammogram scholarship application process to the patient. Confirmed the patient's address and will mail the patient a second mammogram scholarship application. [facility]

## 2024-06-25 NOTE — Telephone Encounter (Signed)
 Notified patient [facility] program attempted to call her 05/27/24 to scheduled her but were unable to contact her.  Patient states she did speak with person who called  and that is when she was given the appointment for 06/20/24.  Notified patient the program will check to see if she still has scholarship and they will be calling her to set up an appointment . Notified patient If scholarship is not active the program will send her an application to apply for scholarship.  Patient agree, asked patient to call office if she does not hear back from them .

## 2024-07-10 ENCOUNTER — Ambulatory Visit: Payer: Self-pay

## 2024-07-15 ENCOUNTER — Telehealth: Payer: Self-pay

## 2024-07-15 NOTE — Telephone Encounter (Signed)
 Telephoned patient at mobile number using interpreter#[phone]. Left a voice message with [facility] (scholarship) contact information.

## 2024-07-23 ENCOUNTER — Other Ambulatory Visit: Payer: Self-pay | Admitting: Internal Medicine

## 2024-07-23 DIAGNOSIS — Z1231 Encounter for screening mammogram for malignant neoplasm of breast: Secondary | ICD-10-CM

## 2024-08-22 ENCOUNTER — Ambulatory Visit
Admission: RE | Admit: 2024-08-22 | Discharge: 2024-08-22 | Disposition: A | Discharge status: Home / Self Care | Source: Ambulatory Visit | Attending: Internal Medicine | Admitting: Internal Medicine

## 2024-08-22 DIAGNOSIS — Z1231 Encounter for screening mammogram for malignant neoplasm of breast: Secondary | ICD-10-CM

## 2024-11-13 ENCOUNTER — Ambulatory Visit: Payer: Self-pay

## 2025-04-15 ENCOUNTER — Other Ambulatory Visit: Payer: Self-pay

## 2025-04-24 ENCOUNTER — Encounter: Payer: Self-pay | Admitting: Internal Medicine
